# Patient Record
Sex: Male | Born: 1960
Health system: Southern US, Community
[De-identification: ages and names within clinical notes are randomized; demographics above are authoritative.]

## PROBLEM LIST (undated history)

## (undated) DIAGNOSIS — Z8619 Personal history of other infectious and parasitic diseases: Secondary | ICD-10-CM

## (undated) DIAGNOSIS — R7401 Elevation of levels of liver transaminase levels: Secondary | ICD-10-CM

## (undated) DIAGNOSIS — M26609 Unspecified temporomandibular joint disorder, unspecified side: Secondary | ICD-10-CM

## (undated) DIAGNOSIS — T7840XA Allergy, unspecified, initial encounter: Secondary | ICD-10-CM

## (undated) DIAGNOSIS — N419 Inflammatory disease of prostate, unspecified: Secondary | ICD-10-CM

## (undated) DIAGNOSIS — H40009 Preglaucoma, unspecified, unspecified eye: Secondary | ICD-10-CM

## (undated) DIAGNOSIS — H409 Unspecified glaucoma: Secondary | ICD-10-CM

## (undated) DIAGNOSIS — F411 Generalized anxiety disorder: Secondary | ICD-10-CM

## (undated) DIAGNOSIS — N189 Chronic kidney disease, unspecified: Secondary | ICD-10-CM

## (undated) DIAGNOSIS — J189 Pneumonia, unspecified organism: Secondary | ICD-10-CM

## (undated) DIAGNOSIS — R740 Nonspecific elevation of levels of transaminase and lactic acid dehydrogenase [LDH]: Secondary | ICD-10-CM

## (undated) DIAGNOSIS — E785 Hyperlipidemia, unspecified: Secondary | ICD-10-CM

## (undated) DIAGNOSIS — K6289 Other specified diseases of anus and rectum: Secondary | ICD-10-CM

## (undated) DIAGNOSIS — R7402 Elevation of levels of lactic acid dehydrogenase (LDH): Secondary | ICD-10-CM

## (undated) DIAGNOSIS — M1990 Unspecified osteoarthritis, unspecified site: Secondary | ICD-10-CM

## (undated) DIAGNOSIS — H6090 Unspecified otitis externa, unspecified ear: Secondary | ICD-10-CM

## (undated) HISTORY — DX: Unspecified temporomandibular joint disorder, unspecified side: M26.609

## (undated) HISTORY — PX: OTHER SURGICAL HISTORY: SHX169

## (undated) HISTORY — DX: Unspecified glaucoma: H40.9

## (undated) HISTORY — DX: Inflammatory disease of prostate, unspecified: N41.9

## (undated) HISTORY — DX: Unspecified osteoarthritis, unspecified site: M19.90

## (undated) HISTORY — DX: Nonspecific elevation of levels of transaminase and lactic acid dehydrogenase (ldh): R74.0

## (undated) HISTORY — DX: Allergy, unspecified, initial encounter: T78.40XA

## (undated) HISTORY — DX: Elevation of levels of liver transaminase levels: R74.01

## (undated) HISTORY — PX: POLYPECTOMY: SHX149

## (undated) HISTORY — DX: Preglaucoma, unspecified, unspecified eye: H40.009

## (undated) HISTORY — DX: Generalized anxiety disorder: F41.1

## (undated) HISTORY — DX: Pneumonia, unspecified organism: J18.9

## (undated) HISTORY — DX: Elevation of levels of lactic acid dehydrogenase (LDH): R74.02

## (undated) HISTORY — DX: Chronic kidney disease, unspecified: N18.9

## (undated) HISTORY — PX: COLONOSCOPY: SHX174

## (undated) HISTORY — DX: Hyperlipidemia, unspecified: E78.5

## (undated) HISTORY — DX: Unspecified otitis externa, unspecified ear: H60.90

## (undated) HISTORY — PX: VASECTOMY: SHX75

## (undated) HISTORY — DX: Personal history of other infectious and parasitic diseases: Z86.19

## (undated) HISTORY — PX: TONSILLECTOMY: SHX5217

## (undated) HISTORY — DX: Other specified diseases of anus and rectum: K62.89

## (undated) HISTORY — PX: APPENDECTOMY: SHX54

---

## 1998-05-21 ENCOUNTER — Emergency Department (HOSPITAL_COMMUNITY): Admission: EM | Admit: 1998-05-21 | Discharge: 1998-05-21 | Payer: Self-pay | Admitting: Emergency Medicine

## 2001-03-11 ENCOUNTER — Encounter: Payer: Self-pay | Admitting: Urology

## 2001-03-11 ENCOUNTER — Ambulatory Visit (HOSPITAL_BASED_OUTPATIENT_CLINIC_OR_DEPARTMENT_OTHER): Admission: RE | Admit: 2001-03-11 | Discharge: 2001-03-11 | Payer: Self-pay | Admitting: Urology

## 2001-03-14 ENCOUNTER — Emergency Department (HOSPITAL_COMMUNITY): Admission: EM | Admit: 2001-03-14 | Discharge: 2001-03-15 | Payer: Self-pay | Admitting: Emergency Medicine

## 2002-06-30 ENCOUNTER — Encounter: Admission: RE | Admit: 2002-06-30 | Discharge: 2002-06-30 | Payer: Self-pay | Admitting: Urology

## 2002-06-30 ENCOUNTER — Encounter: Payer: Self-pay | Admitting: Urology

## 2005-02-13 ENCOUNTER — Ambulatory Visit: Payer: Self-pay | Admitting: Internal Medicine

## 2005-03-14 ENCOUNTER — Ambulatory Visit: Payer: Self-pay | Admitting: Internal Medicine

## 2006-03-26 ENCOUNTER — Ambulatory Visit: Payer: Self-pay | Admitting: Internal Medicine

## 2006-04-02 ENCOUNTER — Ambulatory Visit: Payer: Self-pay | Admitting: Internal Medicine

## 2006-04-03 ENCOUNTER — Ambulatory Visit: Payer: Self-pay | Admitting: Internal Medicine

## 2007-02-11 ENCOUNTER — Encounter: Payer: Self-pay | Admitting: Internal Medicine

## 2007-02-18 ENCOUNTER — Encounter: Payer: Self-pay | Admitting: Internal Medicine

## 2007-05-14 ENCOUNTER — Telehealth: Payer: Self-pay | Admitting: Internal Medicine

## 2007-05-14 ENCOUNTER — Ambulatory Visit: Payer: Self-pay | Admitting: Internal Medicine

## 2007-05-16 LAB — CONVERTED CEMR LAB
Bilirubin Urine: NEGATIVE
Hemoglobin, Urine: NEGATIVE
Ketones, ur: NEGATIVE mg/dL
Leukocytes, UA: NEGATIVE
Nitrite: NEGATIVE
Specific Gravity, Urine: >=1.03 (ref 1.000–1.03)
Total Protein, Urine: NEGATIVE mg/dL
Urine Glucose: NEGATIVE mg/dL
Urobilinogen, UA: 0.2 (ref 0.0–1.0)
pH: 6 (ref 5.0–8.0)

## 2007-07-17 ENCOUNTER — Ambulatory Visit: Payer: Self-pay | Admitting: Internal Medicine

## 2007-07-17 LAB — CONVERTED CEMR LAB
ALT: 29 units/L (ref 0–53)
AST: 24 units/L (ref 0–37)
Albumin: 4 g/dL (ref 3.5–5.2)
Alkaline Phosphatase: 48 units/L (ref 39–117)
BUN: 17 mg/dL (ref 6–23)
Basophils Absolute: 0 10*3/uL (ref 0.0–0.1)
Basophils Relative: 0.3 % (ref 0.0–1.0)
Bilirubin Urine: NEGATIVE
Bilirubin, Direct: 0.1 mg/dL (ref 0.0–0.3)
CO2: 27 meq/L (ref 19–32)
Calcium: 9.5 mg/dL (ref 8.4–10.5)
Chloride: 104 meq/L (ref 96–112)
Cholesterol: 285 mg/dL (ref 0–200)
Creatinine, Ser: 1 mg/dL (ref 0.4–1.5)
Direct LDL: 200.5 mg/dL
Eosinophils Absolute: 0.1 10*3/uL (ref 0.0–0.6)
Eosinophils Relative: 2.3 % (ref 0.0–5.0)
GFR calc Af Amer: 103 mL/min
GFR calc non Af Amer: 85 mL/min
Glucose, Bld: 122 mg/dL — ABNORMAL HIGH (ref 70–99)
HCT: 46 % (ref 39.0–52.0)
HDL: 41.5 mg/dL (ref >39.0)
Hemoglobin, Urine: NEGATIVE
Hemoglobin: 16.3 g/dL (ref 13.0–17.0)
Ketones, ur: NEGATIVE mg/dL
Leukocytes, UA: NEGATIVE
Lymphocytes Relative: 28.9 % (ref 12.0–46.0)
MCHC: 35.4 g/dL (ref 30.0–36.0)
MCV: 95.6 fL (ref 78.0–100.0)
Monocytes Absolute: 0.6 10*3/uL (ref 0.2–0.7)
Monocytes Relative: 11.9 % — ABNORMAL HIGH (ref 3.0–11.0)
Neutro Abs: 2.9 10*3/uL (ref 1.4–7.7)
Neutrophils Relative %: 56.6 % (ref 43.0–77.0)
Nitrite: NEGATIVE
PSA: 0.83 ng/mL (ref 0.10–4.00)
Platelets: 198 10*3/uL (ref 150–400)
Potassium: 4.5 meq/L (ref 3.5–5.1)
RBC: 4.81 M/uL (ref 4.22–5.81)
RDW: 12.4 % (ref 11.5–14.6)
Sodium: 139 meq/L (ref 135–145)
Specific Gravity, Urine: 1.025 (ref 1.000–1.03)
TSH: 3.31 microintl units/mL (ref 0.35–5.50)
Total Bilirubin: 0.9 mg/dL (ref 0.3–1.2)
Total CHOL/HDL Ratio: 6.9
Total Protein, Urine: NEGATIVE mg/dL
Total Protein: 7 g/dL (ref 6.0–8.3)
Triglycerides: 238 mg/dL (ref 0–149)
Urine Glucose: NEGATIVE mg/dL
Urobilinogen, UA: 0.2 (ref 0.0–1.0)
VLDL: 48 mg/dL — ABNORMAL HIGH (ref 0–40)
WBC: 5 10*3/uL (ref 4.5–10.5)
pH: 6 (ref 5.0–8.0)

## 2007-07-22 ENCOUNTER — Encounter: Payer: Self-pay | Admitting: Internal Medicine

## 2007-07-22 DIAGNOSIS — J189 Pneumonia, unspecified organism: Secondary | ICD-10-CM | POA: Insufficient documentation

## 2007-07-22 DIAGNOSIS — E1169 Type 2 diabetes mellitus with other specified complication: Secondary | ICD-10-CM | POA: Insufficient documentation

## 2007-07-22 DIAGNOSIS — M26609 Unspecified temporomandibular joint disorder, unspecified side: Secondary | ICD-10-CM | POA: Insufficient documentation

## 2007-07-22 DIAGNOSIS — Z9189 Other specified personal risk factors, not elsewhere classified: Secondary | ICD-10-CM | POA: Insufficient documentation

## 2007-07-22 DIAGNOSIS — Z8709 Personal history of other diseases of the respiratory system: Secondary | ICD-10-CM | POA: Insufficient documentation

## 2007-07-22 DIAGNOSIS — H60399 Other infective otitis externa, unspecified ear: Secondary | ICD-10-CM | POA: Insufficient documentation

## 2007-07-22 DIAGNOSIS — E785 Hyperlipidemia, unspecified: Secondary | ICD-10-CM

## 2007-07-23 ENCOUNTER — Ambulatory Visit: Payer: Self-pay | Admitting: Internal Medicine

## 2007-08-13 ENCOUNTER — Telehealth: Payer: Self-pay | Admitting: Internal Medicine

## 2007-08-13 ENCOUNTER — Ambulatory Visit: Payer: Self-pay | Admitting: Internal Medicine

## 2007-08-13 DIAGNOSIS — R7401 Elevation of levels of liver transaminase levels: Secondary | ICD-10-CM | POA: Insufficient documentation

## 2007-08-13 DIAGNOSIS — R740 Nonspecific elevation of levels of transaminase and lactic acid dehydrogenase [LDH]: Secondary | ICD-10-CM

## 2007-08-13 LAB — CONVERTED CEMR LAB
Cholesterol: 171 mg/dL (ref 0–200)
Direct LDL: 97.1 mg/dL
HDL: 48.2 mg/dL (ref >39.0)
Hgb A1c MFr Bld: 6 % (ref 4.6–6.0)
Total CHOL/HDL Ratio: 3.5
Triglycerides: 209 mg/dL (ref 0–149)
VLDL: 42 mg/dL — ABNORMAL HIGH (ref 0–40)

## 2007-08-22 ENCOUNTER — Encounter: Payer: Self-pay | Admitting: Internal Medicine

## 2007-09-23 ENCOUNTER — Ambulatory Visit: Payer: Self-pay | Admitting: Internal Medicine

## 2008-03-04 ENCOUNTER — Telehealth: Payer: Self-pay | Admitting: Internal Medicine

## 2008-05-11 ENCOUNTER — Emergency Department (HOSPITAL_COMMUNITY): Admission: EM | Admit: 2008-05-11 | Discharge: 2008-05-11 | Payer: Self-pay | Admitting: Family Medicine

## 2009-01-31 DIAGNOSIS — K6289 Other specified diseases of anus and rectum: Secondary | ICD-10-CM

## 2009-01-31 HISTORY — DX: Other specified diseases of anus and rectum: K62.89

## 2009-02-02 ENCOUNTER — Telehealth: Payer: Self-pay | Admitting: Internal Medicine

## 2009-02-05 ENCOUNTER — Telehealth: Payer: Self-pay | Admitting: Internal Medicine

## 2009-02-05 ENCOUNTER — Ambulatory Visit: Payer: Self-pay | Admitting: Internal Medicine

## 2009-02-08 ENCOUNTER — Telehealth (INDEPENDENT_AMBULATORY_CARE_PROVIDER_SITE_OTHER): Payer: Self-pay | Admitting: *Deleted

## 2009-02-08 ENCOUNTER — Ambulatory Visit: Payer: Self-pay | Admitting: Internal Medicine

## 2009-02-08 LAB — CONVERTED CEMR LAB
ALT: 30 units/L (ref 0–53)
AST: 31 units/L (ref 0–37)
Albumin: 4.3 g/dL (ref 3.5–5.2)
Alkaline Phosphatase: 61 units/L (ref 39–117)
BUN: 18 mg/dL (ref 6–23)
Basophils Absolute: 0.1 10*3/uL (ref 0.0–0.1)
Basophils Relative: 1 % (ref 0.0–3.0)
Bilirubin Urine: NEGATIVE
Bilirubin, Direct: 0.2 mg/dL (ref 0.0–0.3)
CO2: 27 meq/L (ref 19–32)
Calcium: 9.2 mg/dL (ref 8.4–10.5)
Chloride: 105 meq/L (ref 96–112)
Cholesterol: 206 mg/dL — ABNORMAL HIGH (ref 0–200)
Creatinine, Ser: 1.1 mg/dL (ref 0.4–1.5)
Direct LDL: 104 mg/dL
Eosinophils Absolute: 0.1 10*3/uL (ref 0.0–0.7)
Eosinophils Relative: 1.5 % (ref 0.0–5.0)
GFR calc non Af Amer: 75.71 mL/min (ref >60)
Glucose, Bld: 102 mg/dL — ABNORMAL HIGH (ref 70–99)
HCT: 44.8 % (ref 39.0–52.0)
HDL: 42.8 mg/dL (ref >39.00)
Hemoglobin, Urine: NEGATIVE
Hemoglobin: 15.7 g/dL (ref 13.0–17.0)
Ketones, ur: NEGATIVE mg/dL
Leukocytes, UA: NEGATIVE
Lymphocytes Relative: 24.9 % (ref 12.0–46.0)
Lymphs Abs: 1.5 10*3/uL (ref 0.7–4.0)
MCHC: 35 g/dL (ref 30.0–36.0)
MCV: 95.7 fL (ref 78.0–100.0)
Monocytes Absolute: 0.6 10*3/uL (ref 0.1–1.0)
Monocytes Relative: 10.5 % (ref 3.0–12.0)
Neutro Abs: 3.7 10*3/uL (ref 1.4–7.7)
Neutrophils Relative %: 62.1 % (ref 43.0–77.0)
Nitrite: NEGATIVE
PSA: 0.59 ng/mL (ref 0.10–4.00)
Platelets: 172 10*3/uL (ref 150.0–400.0)
Potassium: 4.1 meq/L (ref 3.5–5.1)
RBC: 4.68 M/uL (ref 4.22–5.81)
RDW: 12.5 % (ref 11.5–14.6)
Sodium: 141 meq/L (ref 135–145)
Specific Gravity, Urine: >=1.03 (ref 1.000–1.030)
TSH: 3.03 microintl units/mL (ref 0.35–5.50)
Total Bilirubin: 1 mg/dL (ref 0.3–1.2)
Total CHOL/HDL Ratio: 5
Total Protein, Urine: NEGATIVE mg/dL
Total Protein: 7.1 g/dL (ref 6.0–8.3)
Triglycerides: 359 mg/dL — ABNORMAL HIGH (ref 0.0–149.0)
Urine Glucose: NEGATIVE mg/dL
Urobilinogen, UA: 0.2 (ref 0.0–1.0)
VLDL: 71.8 mg/dL — ABNORMAL HIGH (ref 0.0–40.0)
WBC: 6 10*3/uL (ref 4.5–10.5)
pH: 5.5 (ref 5.0–8.0)

## 2009-02-09 ENCOUNTER — Ambulatory Visit: Payer: Self-pay | Admitting: Internal Medicine

## 2009-02-09 ENCOUNTER — Ambulatory Visit: Payer: Self-pay | Admitting: Gastroenterology

## 2009-02-09 DIAGNOSIS — F411 Generalized anxiety disorder: Secondary | ICD-10-CM | POA: Insufficient documentation

## 2009-02-10 DIAGNOSIS — H40009 Preglaucoma, unspecified, unspecified eye: Secondary | ICD-10-CM | POA: Insufficient documentation

## 2009-02-15 ENCOUNTER — Ambulatory Visit: Payer: Self-pay | Admitting: Gastroenterology

## 2009-02-15 ENCOUNTER — Encounter: Payer: Self-pay | Admitting: Gastroenterology

## 2009-02-18 ENCOUNTER — Encounter: Payer: Self-pay | Admitting: Gastroenterology

## 2009-10-11 ENCOUNTER — Telehealth: Payer: Self-pay | Admitting: Internal Medicine

## 2010-03-24 ENCOUNTER — Telehealth: Payer: Self-pay | Admitting: Internal Medicine

## 2010-04-22 ENCOUNTER — Telehealth: Payer: Self-pay | Admitting: Internal Medicine

## 2010-05-02 ENCOUNTER — Telehealth (INDEPENDENT_AMBULATORY_CARE_PROVIDER_SITE_OTHER): Payer: Self-pay | Admitting: *Deleted

## 2010-05-03 ENCOUNTER — Ambulatory Visit: Payer: Self-pay | Admitting: Internal Medicine

## 2010-05-03 DIAGNOSIS — M79609 Pain in unspecified limb: Secondary | ICD-10-CM | POA: Insufficient documentation

## 2010-05-05 LAB — CONVERTED CEMR LAB
ALT: 29 units/L (ref 0–53)
AST: 28 units/L (ref 0–37)
Albumin: 3.9 g/dL (ref 3.5–5.2)
Alkaline Phosphatase: 55 units/L (ref 39–117)
Bilirubin, Direct: 0.1 mg/dL (ref 0.0–0.3)
Cholesterol: 224 mg/dL — ABNORMAL HIGH (ref 0–200)
Direct LDL: 136.6 mg/dL
HDL: 42.7 mg/dL (ref >39.00)
Total Bilirubin: 0.7 mg/dL (ref 0.3–1.2)
Total CHOL/HDL Ratio: 5
Total Protein: 6.6 g/dL (ref 6.0–8.3)
Triglycerides: 339 mg/dL — ABNORMAL HIGH (ref 0.0–149.0)
Uric Acid, Serum: 8.7 mg/dL — ABNORMAL HIGH (ref 4.0–7.8)
VLDL: 67.8 mg/dL — ABNORMAL HIGH (ref 0.0–40.0)

## 2010-05-09 ENCOUNTER — Telehealth: Payer: Self-pay | Admitting: Internal Medicine

## 2010-05-30 ENCOUNTER — Ambulatory Visit: Payer: Self-pay | Admitting: Internal Medicine

## 2010-06-10 ENCOUNTER — Ambulatory Visit: Payer: Self-pay | Admitting: Internal Medicine

## 2010-06-20 ENCOUNTER — Ambulatory Visit: Payer: Self-pay | Admitting: Internal Medicine

## 2010-07-25 ENCOUNTER — Ambulatory Visit
Admission: RE | Admit: 2010-07-25 | Discharge: 2010-07-25 | Payer: Self-pay | Source: Home / Self Care | Attending: Internal Medicine | Admitting: Internal Medicine

## 2010-07-25 ENCOUNTER — Encounter: Payer: Self-pay | Admitting: Internal Medicine

## 2010-07-25 DIAGNOSIS — E669 Obesity, unspecified: Secondary | ICD-10-CM | POA: Insufficient documentation

## 2010-07-25 DIAGNOSIS — M109 Gout, unspecified: Secondary | ICD-10-CM | POA: Insufficient documentation

## 2010-08-02 NOTE — Progress Notes (Signed)
Summary: REFILL - Alprazolam  Phone Note Refill Request   Refills Requested: Medication #1:  ALPRAZOLAM 0.5 MG  TABS 1 q 6 hrs as needed [facility]  Initial call taken by: Lamar Sprinkles, CMA,  April 22, 2010 6:08 PM  Follow-up for Phone Call        OK to refill z 5  Follow-up by: Jacques Navy MD,  April 25, 2010 9:00 AM    Prescriptions: ALPRAZOLAM 0.5 MG  TABS (ALPRAZOLAM) 1 q 6 hrs as needed, take 30 min prior to flying  #30 x 5   Entered by:   Lamar Sprinkles, CMA   Authorized by:   Jacques Navy MD   Signed by:   Lamar Sprinkles, CMA on 04/25/2010   Method used:   Telephoned to ...       [facility] [address] (retail)       [address]       [place], [place]  [address]       Ph: [phone] or [phone]       Fax: [phone]   RxID:   [id number]

## 2010-08-02 NOTE — Assessment & Plan Note (Signed)
Summary: per phone note/gout/cd   Vital Signs:  Patient profile:   50 year old male Height:      66 inches Weight:      222 pounds BMI:     35.96 O2 Sat:      97 % on Room air Temp:     98.3 degrees F oral Pulse rate:   74 / minute BP sitting:   126 / 82  (left arm) Cuff size:   large  Vitals Entered By: Ami Bullins CMA (May 03, 2010 9:43 AM)  O2 Flow:  Room air  Primary Care Provider:  Micheal Norins,MD   History of Present Illness: [name] presents today with a five day history of right foot pain.  He noticed the pain friday morning when it awoke him from sleep at 1am. He states that he had been on his feet a lot over the week at work and usually had a general ache at the MTP joint in his big toe secondary to an old injury.  However, he notes that this pain was different than he had ever felt before.  It was throbbing in nature and was the most severe along the bottom of his big toe.  He states that the pain was very severe and he could not put hardly any weight on it at all that night.  HE did noticed that it looked slightly swollen and red at the time fo the pain.  In the morning he took Advil (about 3-4 pills with each meal) and the pain went away and has not returned.  Currently he just reports some soreness but no real pain in his toes.  Current Medications (verified): 1)  Alprazolam 0.5 Mg  Tabs (Alprazolam) .Marland Kitchen.. 1 Every 6 Hours As Needed, Take 30 Minutes Prior To Flying 2)  Crestor 5 Mg  Tabs (Rosuvastatin Calcium) .Marland Kitchen.. 1 By Mouth Daily 3)  Anamantle Hc 3-0.5 % Crea (Lidocaine-Hydrocortisone Ace) .... Rectal Applicator Every 4 Hrs As Needed  Allergies (verified): No Known Drug Allergies  Past History:  Past Medical History: Last updated: 02/09/2009 ANXIETY (ICD-300.00) ANAL OR RECTAL PAIN (ZOX-096.04) Aug '10 ALANINE AMINOTRANSFERASE, SERUM, ELEVATED (ICD-790.4) * TRACHEOTOMY Hx of UNSPECIFIED PREGLAUCOMA (ICD-365.00) Hx of TMJ SYNDROME  (ICD-524.60) EXTERNAL OTITIS (ICD-380.10) PROSTATITIS, HX OF 1999 (ICD-V13.09) Hx of PNEUMONIA, BILATERAL (ICD-486) CHICKENPOX, HX OF (ICD-V15.9) PNEUMONIA, HX OF (ICD-V12.60) HYPERLIPIDEMIA (ICD-272.4)  Past Surgical History: Last updated: 02/09/2009 Vasectomy * TRACHEOTOMY Appendectomy  Family History: Last updated: 02/09/2009 father -[birth year]19, RA, Prostate cancer mother - deceased @ 32; broke her hip, EtOH,  Neg- CAD/MI, colon cancer Pos_ lipids Family History of Colon Polyps: father Family History of Breast Cancer: mother  Social History: Last updated: 02/09/2009 HSG married - '87 1 daughter - '87, '90, '97 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 Alcohol Use - yes 3 beers a day Daily Caffeine Use 3 coffee  Risk Factors: Alcohol Use: 3 (07/23/2007) Caffeine Use: 5+ (07/23/2007) Exercise: no (07/23/2007)  Risk Factors: Smoking Status: quit (09/23/2007) Packs/Day: 1/2 ppd (07/22/2007)  Physical Exam  General:  alert, well-developed, and well-nourished.   Head:  normocephalic and atraumatic.   Eyes:  pupils equal, pupils round, and pupils reactive to light.   Nose:  no external deformity and no nasal discharge.   Mouth:  pharynx pink and moist, no erythema, and no exudates.   Neck:  supple, full ROM, and no masses.   Lungs:  normal respiratory effort, normal breath sounds, no  crackles, and no wheezes.   Heart:  normal rate, regular rhythm, no murmur, no gallop, and no rub.   Msk:  normal ROM,  joint warmth, and no crepitation.  Slight tenderness to palpation and erythema over the 1st right MTP joint.  Some limited ROM at the same MTP joint. Pulses:  R radial normal and L radial normal.   Neurologic:  alert & oriented X3, cranial nerves II-XII intact, strength normal in all extremities, sensation intact to light touch, and sensation intact to pinprick.   Skin:  turgor normal, color normal, and no rashes.   Psych:   Oriented X3, memory intact for recent and remote, and good eye contact.     Impression & Recommendations:  Problem # 1:  FOOT PAIN, RIGHT (ICD-729.5) This appears to be an actue attack of gouty arthritis due to the location at the MTP joint, the severity of the pain, and the relief of the pain with NSAID use.  Plan:  Test blood urinc acid level           X-ray series of the foot           Diet changes - patient given information about low protein diet.   Orders: T-Foot Right (73630TC) TLB-Uric Acid, Blood (84550-URIC)  DG FOOT COMPLETE*R* - [id number]   Clinical Data: Right first MTP joint pain   RIGHT FOOT COMPLETE - 3+ VIEW   Comparison: None.   Findings: Degenerative osteoarthritic changes of the right first MTP joint with sclerosis, joint space narrowing and osteophyte formation.  Normal alignment.  Negative for fracture.   IMPRESSION: Right first MTP joint osteoarthritis.   Read By:  Sigurd Sos.,  M.D.  addendum- uric acid 8.7  Problem # 2:  HYPERLIPIDEMIA (ICD-272.4) Due to the patients history of hyperlipidemnia and since he is due for a physical soon LFTs and lipid panel will be ordered as well.  Plan:  Lipid pannel to monitor lipid levels.             Liver function test since he is on Crestor  His updated medication list for this problem includes:    Crestor 5 Mg Tabs (Rosuvastatin calcium) .Marland Kitchen... 1 by mouth daily  Orders: TLB-Lipid Panel (80061-LIPID) TLB-Hepatic/Liver Function Pnl (80076-HEPATIC)  addendum LDL 136.6 Continue present medications.   Complete Medication List: 1)  Alprazolam 0.5 Mg Tabs (Alprazolam) .Marland Kitchen.. 1 every 6 hours as needed, take 30 minutes prior to flying 2)  Crestor 5 Mg Tabs (Rosuvastatin calcium) .Marland Kitchen.. 1 by mouth daily 3)  Anamantle Hc 3-0.5 % Crea (Lidocaine-hydrocortisone ace) .... Rectal applicator every 4 hrs as needed   Orders Added: 1)  T-Foot Right [73630TC] 2)  TLB-Uric Acid, Blood [84550-URIC] 3)  TLB-Lipid  Panel [80061-LIPID] 4)  TLB-Hepatic/Liver Function Pnl [80076-HEPATIC] 5)  Est. Patient Level III [62952]

## 2010-08-02 NOTE — Progress Notes (Signed)
  Phone Note Refill Request Message from:  Fax from Pharmacy on October 11, 2009 4:24 PM  Refills Requested: Medication #1:  ALPRAZOLAM 0.5 MG  TABS 1 q 6 hrs as needed   Last Refilled: 02/05/2009 [facility] [address], please Advise refill.  Initial call taken by: Ami Bullins CMA,  October 11, 2009 4:24 PM  Follow-up for Phone Call        ok for refill x 6 months Follow-up by: Jacques Navy MD,  October 12, 2009 5:43 AM    Prescriptions: ALPRAZOLAM 0.5 MG  TABS (ALPRAZOLAM) 1 q 6 hrs as needed, take 30 min prior to flying  #30 x 5   Entered by:   Ami Bullins CMA   Authorized by:   Jacques Navy MD   Signed by:   Bill Salinas CMA on 10/12/2009   Method used:   Telephoned to ...       [facility] [address] (retail)       [address]       [place], [place]  [address]       Ph: [phone] or [phone]       Fax: [phone]   RxID:   [id number]

## 2010-08-02 NOTE — Progress Notes (Signed)
Summary: L[facility]  Phone Note Refill Request Message from:  Fax from Pharmacy on March 24, 2010 2:59 PM  Refills Requested: Medication #1:  ANAMANTLE HC 3-0.5 % CREA rectal applicator every 4 hrs as needed. Please Advise refills  Initial call taken by: Ami Bullins CMA,  March 24, 2010 3:00 PM  Follow-up for Phone Call        ok for refill as needed  Follow-up by: Jacques Navy MD,  March 25, 2010 6:23 PM    Prescriptions: ANAMANTLE HC 3-0.5 % CREA (LIDOCAINE-HYDROCORTISONE ACE) rectal applicator every 4 hrs as needed  #7g x 12   Entered by:   Lamar Sprinkles, CMA   Authorized by:   Jacques Navy MD   Signed by:   Lamar Sprinkles, CMA on 03/25/2010   Method used:   Electronically to        [facility] [phone][facility] (retail)       [address]       [place], [place]  [address]       Ph: [phone] or [phone]       Fax: [phone]   RxID:   [id number]

## 2010-08-02 NOTE — Progress Notes (Signed)
Summary: RESULTS  Phone Note Call from Patient   Summary of Call: Patient is requesting results of xray and labs. I see you left vm but he did not get message, what do I tell pt?  (left vm for pt to check his messages for Vm from MD) Initial call taken by: Lamar Sprinkles, CMA,  May 09, 2010 3:20 PM  Follow-up for Phone Call        uric acid was 8.7 elevated - would usually not start prophylaxis unless more than 2 events in 12 months , but I am negotiable. X-ray reveals OA at the great toe joint. No gout related changes - tophi Follow-up by: Jacques Navy MD,  May 10, 2010 1:13 PM  Additional Follow-up for Phone Call Additional follow up Details #1::        Pt informed, he would like to start preventative treatment for gout. Pt states he travels for work often and has trouble getting seen by an MD for gout flares.   Also would like to know results of lipids but is ok to wait to discuss at cpx apt in December.  Additional Follow-up by: Lamar Sprinkles, CMA,  May 10, 2010 5:38 PM    Additional Follow-up for Phone Call Additional follow up Details #2::    allopurinol 100mg  once daily x 10 days, then advance to 200mg   a day x 10 days, then 300mg  x 10 days and after day 10 come for lab - Uric acid 274.9. Continue 300mg  daily but will increase if uric acid not controlled. # 100. If 300mg  is maintenance dose will change to a 300mg  tablet.  LDL 136.+ - wioth a goal of 130 or less.   Follow-up by: Jacques Navy MD,  May 10, 2010 5:45 PM  Additional Follow-up for Phone Call Additional follow up Details #3:: Details for Additional Follow-up Action Taken: Pt informed  Additional Follow-up by: Lamar Sprinkles, CMA,  May 11, 2010 10:14 AM  New/Updated Medications: ALLOPURINOL 100 MG TABS (ALLOPURINOL) 1 once daily x 10 days, then 2 once daily x 10 days then 3 once daily x 10 days Prescriptions: ALLOPURINOL 100 MG TABS (ALLOPURINOL) 1 once daily x 10 days, then 2 once  daily x 10 days then 3 once daily x 10 days  #100 x 0   Entered by:   Lamar Sprinkles, CMA   Authorized by:   Jacques Navy MD   Signed by:   Lamar Sprinkles, CMA on 05/11/2010   Method used:   Electronically to        [facility] [phone][facility] (retail)       [address]       [place], [place]  [address]       Ph: [phone] or [phone]       Fax: [phone]   RxID:   [id number]

## 2010-08-02 NOTE — Progress Notes (Signed)
Summary: NEEDS OV   Phone Note Call from Patient   Summary of Call: Pt left vm, he thinks he may have gout problem w/his foot. Please call pt, he needs office visit w/Norins for eval. THANKS Initial call taken by: Lamar Sprinkles, CMA,  May 02, 2010 11:45 AM  Follow-up for Phone Call        Sched for appt 11/1 at 9:45 am for acute visit to check foot. Pt aware this is to eval foot. Follow-up by: Verdell Face,  May 02, 2010 11:54 AM

## 2010-08-04 NOTE — Assessment & Plan Note (Signed)
Summary: CPX/ [facility]WS  //cd   Vital Signs:  Patient profile:   50 year old male Height:      66 inches Weight:      213 pounds BMI:     34.50 O2 Sat:      96 % on Room air Temp:     98.4 degrees F oral Pulse rate:   64 / minute BP sitting:   106 / 68  (left arm) Cuff size:   large  Vitals Entered By: Ami Bullins CMA (July 25, 2010 9:59 AM)  O2 Flow:  Room air CC: pt here for cpx/ ab   Primary Care Provider:  Micheal Izaac Reisig,MD  CC:  pt here for cpx/ ab.  History of Present Illness: Patient presents for annual wellness exam. He has had no major illness, injury or surgery in the interval since his last visit. He has had a gain of 10 to 12 lbs. He is working out of town a lot and has had more barriers to proper diet and to regular exercise. He also has not been able to make his usual hunting trips. Otherwise he is doing well. He does admit to a high stress, 15hr/day job. With all this he feels his home life is better than ever.   Preventive Screening-Counseling & Management  Alcohol-Tobacco     Alcohol drinks/day: 4-6     Alcohol type: beer     >5/day in last 3 mos: yes     Alcohol Counseling: to decrease amount and/or frequency of alcohol intake     Feels need to cut down: yes     Feels annoyed by complaints: no     Feels guilty re: drinking: no     Needs 'eye opener' in am: no     Smoking Status: never  Caffeine-Diet-Exercise     Caffeine use/day: 1-2 caffienated beverages /er dap     Diet Comments: needs improvement     Diet Counseling: to improve diet; diet is suboptimal     Does Patient Exercise: no  Hep-HIV-STD-Contraception     Hepatitis Risk: no risk noted     HIV Risk: no risk noted     STD Risk: no risk noted     Sun Exposure-Excessive: no  Safety-Violence-Falls     Seat Belt Use: yes     Helmet Use: n/a     Firearms in the Home: firearms in the home     Smoke Detectors: yes      Sexual History:  currently monogamous.        Drug Use:  never.     Blood Transfusions:  no.    Current Medications (verified): 1)  Alprazolam 0.5 Mg  Tabs (Alprazolam) .Marland Kitchen.. 1 Every 6 Hours As Needed, Take 30 Minutes Prior To Flying 2)  Crestor 5 Mg  Tabs (Rosuvastatin Calcium) .Marland Kitchen.. 1 By Mouth Daily 3)  Anamantle Hc 3-0.5 % Crea (Lidocaine-Hydrocortisone Ace) .... Rectal Applicator Every 4 Hrs As Needed 4)  Allopurinol 100 Mg Tabs (Allopurinol) .Marland Kitchen.. 1 Once Daily X 10 Days, Then 2 Once Daily X 10 Days Then 3 Once Daily X 10 Days  Allergies (verified): 1)  ! Cipro  Past History:  Past Medical History: Last updated: 02/09/2009 ANXIETY (ICD-300.00) ANAL OR RECTAL PAIN (ZOX-096.04) Aug '10 ALANINE AMINOTRANSFERASE, SERUM, ELEVATED (ICD-790.4) * TRACHEOTOMY Hx of UNSPECIFIED PREGLAUCOMA (ICD-365.00) Hx of TMJ SYNDROME (ICD-524.60) EXTERNAL OTITIS (ICD-380.10) PROSTATITIS, HX OF 1999 (ICD-V13.09) Hx of PNEUMONIA, BILATERAL (ICD-486) CHICKENPOX, HX OF (ICD-V15.9) PNEUMONIA, HX OF (  ICD-V12.60) HYPERLIPIDEMIA (ICD-272.4)  Past Surgical History: Last updated: 02/09/2009 Vasectomy * TRACHEOTOMY Appendectomy  Family History: Last updated: 02/09/2009 father -[birth year]19, RA, Prostate cancer mother - deceased @ 41; broke her hip, EtOH,  Neg- CAD/MI, colon cancer Pos_ lipids Family History of Colon Polyps: father Family History of Breast Cancer: mother  Social History: HSG married - '87 3 daughters - '87, '90, '97 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 Alcohol Use - yes 3+ beers a day Daily Caffeine Use 3 coffee Smoking Status:  never Caffeine use/day:  1-2 caffienated beverages /er dap Hepatitis Risk:  no risk noted HIV Risk:  no risk noted STD Risk:  no risk noted Sun Exposure-Excessive:  no Seat Belt Use:  yes Sexual History:  currently monogamous Drug Use:  never Blood Transfusions:  no  Review of Systems       The patient complains of weight gain and suspicious skin lesions.  The patient  denies anorexia, fever, weight loss, vision loss, decreased hearing, hoarseness, chest pain, dyspnea on exertion, peripheral edema, prolonged cough, headaches, abdominal pain, severe indigestion/heartburn, incontinence, muscle weakness, difficulty walking, depression, abnormal bleeding, and enlarged lymph nodes.    Physical Exam  General:  alert, well-developed, and well-hydrated, overweight white male in no distress.   Head:  normocephalic, atraumatic, and no abnormalities observed.   Eyes:  vision grossly intact, pupils equal, pupils round, and corneas and lenses clear.   Ears:  R ear normal and L ear normal.   Nose:  no external deformity, no external erythema, and no nasal discharge.   Mouth:  Oral mucosa and oropharynx without lesions or exudates.  Teeth in good repair. Neck:  supple, full ROM, no thyromegaly, and no carotid bruits.   Chest Wall:  No deformities, masses, tenderness or gynecomastia noted. Lungs:  Normal respiratory effort, chest expands symmetrically. Lungs are clear to auscultation, no crackles or wheezes. Heart:  Normal rate and regular rhythm. S1 and S2 normal without gallop, murmur, click, rub or other extra sounds. Abdomen:  overweight, soft, non-tender, normal bowel sounds, no guarding, no rigidity, no abdominal hernia, and no hepatomegaly.   Prostate:  deferred to up-coming PSA Msk:  normal ROM, no joint tenderness, no joint swelling, no joint warmth, and no redness over joints.   Pulses:  2+ radial Neurologic:  alert & oriented X3, cranial nerves II-XII intact, strength normal in all extremities, gait normal, and DTRs symmetrical and normal.   Skin:  dark, varigated lesion right temple with an irregular border and raised. Reportedly changing. Cervical Nodes:  no anterior cervical adenopathy and no posterior cervical adenopathy.   Psych:  Oriented X3, memory intact for recent and remote, normally interactive, and good eye contact.     Impression &  Recommendations:  Problem # 1:  Hx of UNSPECIFIED PREGLAUCOMA (ICD-365.00) Patient has just seen his opthamologist and is doing well.   Problem # 2:  HYPERLIPIDEMIA (ICD-272.4) Reviewd lipid panels: in '09 LDL 200; with treatment LDL as low as 104. Last lab in November '11 with HDL 41 LDL 136.6. He admits that he doesn't take Crestor regularly. Framingham data risk calculator with 10 year risk for cardiac event at 5% (low risk) based on last labs and current BP.   Plan - take Crestor daily          f/u lab May '12  His updated medication list for this problem includes:    Crestor 5 Mg Tabs (Rosuvastatin calcium) .Marland Kitchen... 1 by mouth  daily  Problem # 3:  OBESITY, CLASS I (ICD-278.02) Discussed the overall risk to his health posed by being overweight. Discussed weight management: smart food choices; PORTION SIZE  control and regular aerobic exercise - 15-60min daily. Target weight 190lbs; goal - to loose 1 lb per month to target.   Problem # 4:  NEOPLASM, SKIN, UNCERTAIN BEHAVIOR (ICD-238.2) suspicious mole at right temple.  Plan - patient to schedule mole excision in 1 month  Problem # 5:  Preventive Health Care (ICD-V70.0) Interval history without illness but he has had weight gain. He does have a stressfull job and he is on the road a lot. He does c/o nocturia from 1-3 times a night. His exam is benign. Previous labs are OK except for cholesterol levels.  He is current with colorectal cancer screening August '10 due for follow-up in '20. Last PSA August '10 was 0.59 and will have repeat lab May '12. 12 lead EKG without evidence of ischemia or abnormality.  In summary - a very nice man who has a stressful work load and has neglected his health: exercise and diet. He resolves to do better. Will recheck labs in May '12.   Problem # 6:  GOUT, UNSPECIFIED (ICD-274.9) Did review last lab with elevated uric acid level, great than 8. He has had only one episode of gout. He has stopped taking  allopurinol.  Plan - low purine diet           for gout flare ok to use OTC NSAIDS  His updated medication list for this problem includes:    Allopurinol 100 Mg Tabs (Allopurinol) .Marland Kitchen... 1 once daily x 10 days, then 2 once daily x 10 days then 3 once daily x 10 days  Complete Medication List: 1)  Alprazolam 0.5 Mg Tabs (Alprazolam) .Marland Kitchen.. 1 every 6 hours as needed, take 30 minutes prior to flying 2)  Crestor 5 Mg Tabs (Rosuvastatin calcium) .Marland Kitchen.. 1 by mouth daily 3)  Anamantle Hc 3-0.5 % Crea (Lidocaine-hydrocortisone ace) .... Rectal applicator every 4 hrs as needed 4)  Allopurinol 100 Mg Tabs (Allopurinol) .Marland Kitchen.. 1 once daily x 10 days, then 2 once daily x 10 days then 3 once daily x 10 days  Other Orders: EKG w/ Interpretation (93000)  Patient: WARNER LADUCA Note: All result statuses are Final unless otherwise noted.  Tests: (1) Uric Acid (URIC)   Uric Acid            [H]  8.7 mg/dL                   1.6-1.0  Tests: (2) Lipid Panel (LIPID)   Cholesterol          [H]  224 mg/dL                   9-604     ATP III Classification            Desirable:  < 200 mg/dL                    Borderline High:  200 - 239 mg/dL               High:  > = 240 mg/dL   Triglycerides        [H]  339.0 mg/dL                 5.4-098.1     Normal:  <150 mg/dL     Borderline High:  150 - 199 mg/dL   HDL                       13.08 mg/dL                 >65.78   VLDL Cholesterol     [H]  67.8 mg/dL                  4.6-96.2  CHO/HDL Ratio:  CHD Risk                             5                    Men          Women     1/2 Average Risk     3.4          3.3     Average Risk          5.0          4.4     2X Average Risk          9.6          7.1     3X Average Risk          15.0          11.0                           Tests: (3) Hepatic/Liver Function Panel (HEPATIC)   Total Bilirubin           0.7 mg/dL                   9.5-2.8   Direct Bilirubin          0.1 mg/dL                   4.1-3.2    Alkaline Phosphatase      55 U/L                      39-117   AST                       28 U/L                      0-37   ALT                       29 U/L                      0-53   Total Protein             6.6 g/dL                    4.4-0.1   Albumin                   3.9 g/dL                    0.2-7.2  Tests: (4) Cholesterol LDL - Direct (DIRLDL)  Cholesterol LDL - Direct  136.6 mg/dL  Orders Added: 1)  Est. Patient 40-64 years [99396] 2)  Est. Patient Level III [01027] 3)  EKG w/ Interpretation [93000]

## 2010-08-26 ENCOUNTER — Telehealth: Payer: Self-pay | Admitting: Internal Medicine

## 2010-08-26 ENCOUNTER — Other Ambulatory Visit: Payer: 59

## 2010-08-30 NOTE — Progress Notes (Signed)
Summary: LABS FYI  Phone Note From Other Clinic   Summary of Call: Pt came into lab early this am and told them he had cpx scheduled. They drew labs for cpx orders and called to get details of what MD would like. Per last office visit notes - pt is due for labs 11/2010. Advised pt of this and specimen was discarded. He thought b/c he was comming in for mole removal he would do labs early. I advised it would be best to stay w/May date w/fear that insurance may not pay b/c it may be too early.  Initial call taken by: Lamar Sprinkles, CMA,  August 26, 2010 11:23 AM

## 2010-09-02 ENCOUNTER — Other Ambulatory Visit: Payer: Self-pay | Admitting: Internal Medicine

## 2010-09-02 ENCOUNTER — Encounter: Payer: Self-pay | Admitting: Internal Medicine

## 2010-09-02 ENCOUNTER — Ambulatory Visit (INDEPENDENT_AMBULATORY_CARE_PROVIDER_SITE_OTHER): Payer: 59 | Admitting: Internal Medicine

## 2010-09-02 ENCOUNTER — Ambulatory Visit: Payer: Self-pay | Admitting: Internal Medicine

## 2010-09-02 DIAGNOSIS — D485 Neoplasm of uncertain behavior of skin: Secondary | ICD-10-CM

## 2010-09-08 NOTE — Assessment & Plan Note (Signed)
Summary: mole removal   Vital Signs:  Patient profile:   50 year old male Height:      66 inches Weight:      223 pounds BMI:     36.12 O2 Sat:      97 % on Room air Temp:     97.8 degrees F oral Pulse rate:   59 / minute BP sitting:   100 / 68  (left arm) Cuff size:   large  Vitals Entered By: Ami Bullins CMA (September 02, 2010 8:50 AM)  O2 Flow:  Room air CC: pt here for mole removal / ab   Primary Care Provider:  Micheal Akshaya Toepfer,MD  CC:  pt here for mole removal / ab.  History of Present Illness: Jeffrey Dougherty presents for mole removal - change, varigated mole right scalp at the hair line.   Current Medications (verified): 1)  Alprazolam 0.5 Mg  Tabs (Alprazolam) .Marland Kitchen.. 1 Every 6 Hours As Needed, Take 30 Minutes Prior To Flying 2)  Crestor 5 Mg  Tabs (Rosuvastatin Calcium) .Marland Kitchen.. 1 By Mouth Daily 3)  Anamantle Hc 3-0.5 % Crea (Lidocaine-Hydrocortisone Ace) .... Rectal Applicator Every 4 Hrs As Needed 4)  Allopurinol 100 Mg Tabs (Allopurinol) .Marland Kitchen.. 1 Once Daily X 10 Days, Then 2 Once Daily X 10 Days Then 3 Once Daily X 10 Days  Allergies (verified): 1)  ! Cipro   Impression & Recommendations:  Problem # 1:  NEOPLASM, SKIN, UNCERTAIN BEHAVIOR (ICD-238.2)  removed suspicious mole from scalp. Path pending.   Orders: Shave Skin Lesion <0.5cm Scalp/neck/hands/feet/genitalia (82956)  Complete Medication List: 1)  Alprazolam 0.5 Mg Tabs (Alprazolam) .Marland Kitchen.. 1 every 6 hours as needed, take 30 minutes prior to flying 2)  Crestor 5 Mg Tabs (Rosuvastatin calcium) .Marland Kitchen.. 1 by mouth daily 3)  Anamantle Hc 3-0.5 % Crea (Lidocaine-hydrocortisone ace) .... Rectal applicator every 4 hrs as needed 4)  Allopurinol 100 Mg Tabs (Allopurinol) .Marland Kitchen.. 1 once daily x 10 days, then 2 once daily x 10 days then 3 once daily x 10 days   Orders Added: 1)  Shave Skin Lesion <0.5cm Scalp/neck/hands/feet/genitalia [11305]     Procedure Note  Mole Biopsy/Removal: The patient complains of changing  mole. Indication: changing lesion  Procedure # 1: shave biopsy    Size (in cm): .3 x .5    Location: right scalp anterior    Instrument used: dermablade    Anesthesia: 2% xylo w/ epi    Closure: hyfrecator  Cleaned and prepped with: betadine Additional Instructions: routine verbal informed consent. Routine wound precautions. Specimen to lab.

## 2010-09-13 NOTE — Miscellaneous (Signed)
Summary: Consent for Mole Removal / [place]  Consent for Mole Removal / [place]   Imported By: Lennie Odor 09/05/2010 10:40:01  _____________________________________________________________________  External Attachment:    Type:   Image     Comment:   External Document

## 2010-11-18 ENCOUNTER — Other Ambulatory Visit: Payer: 59

## 2010-11-18 ENCOUNTER — Other Ambulatory Visit: Payer: Self-pay | Admitting: Internal Medicine

## 2010-11-18 DIAGNOSIS — Z1289 Encounter for screening for malignant neoplasm of other sites: Secondary | ICD-10-CM

## 2010-11-18 DIAGNOSIS — T887XXA Unspecified adverse effect of drug or medicament, initial encounter: Secondary | ICD-10-CM

## 2010-11-18 DIAGNOSIS — E785 Hyperlipidemia, unspecified: Secondary | ICD-10-CM

## 2010-11-18 NOTE — Op Note (Signed)
[facility]  Patient:    Jeffrey Dougherty, Jeffrey Dougherty Visit Number: [id number] MRN: [id number]          Service Type: NES Location: [facility] Attending Physician:  Trisha Mangle Dictated by:   Veverly Fells Vernie Ammons, M.D. Proc. Date: 03/11/01 Admit Date:  03/11/2001                             Operative Report  PREOPERATIVE DIAGNOSIS:  Right ureteral calculus.  POSTOPERATIVE DIAGNOSIS:  Right ureteral calculus.  PROCEDURE:  Cystoscopy, right retrograde pyelogram with interpretation, right ureteroscopic stone extraction and double J stent placement.  SURGEON:  Mark C. Vernie Ammons, M.D.  ANESTHESIA:  General.  DRAINS:  A 4.7 French 26 cm length double J stent with string.  SPECIMEN:  Stone given to patient.  ESTIMATED BLOOD LOSS:  10 cc.  COMPLICATIONS:  None.  INDICATIONS FOR PROCEDURE:  The patient is a 50 year old white male whose had intermittent right flank pain secondary to a distal right ureteral calculus. He has been followed for some time and has not progressed. He is brought to the OR for ureteroscopic extraction and understands the risks, complications, alternatives, and limitations.  DESCRIPTION OF PROCEDURE:  After informed consent, the patient brought to the major OR, placed on the table and administered general anesthesia and then moved to the dorsal lithotomy position. His genitalia was sterilely prepped and draped and initially a 19 French cystoscopic sheath was introduced in the bladder. The 12 degree lens was inserted and the bladder was fully inspected and noted to be free of any tumor, stones or inflammatory lesions. The right ureteral orifice was identified and cannulated with the 6 French open end ureteral catheter and a retrograde pyelogram was performed in the standard fashion. It revealed filling defects in the distal ureter consistent with those seen on his KUB taken earlier in the morning. I therefore inserted a 0.038 inch floppy tip  guidewire through the cystoscope up the right ureter under direct fluoroscopic control into the renal pelvis after which the ureteral dilating balloon was then passed over the guidewire and the ureteral orifice and distal ureter were dilated. I then removed the dilating balloon and left the guidewire in place. Next to the guidewire, the 6 French rigid ureteroscope was then passed into the distal ureter and the stone was observed and photographed and then extracted using [employer]. I then reinserted the ureteroscope and noted a second smaller stone which was grasped with a three prong grasper. Finally, I inserted the scope a last time, passed it up the ureter and noted no further stones or other abnormalities.  The cystoscope was back loaded over the guidewire and the double J stent passed over the guidewire. The guidewire removed with good curl being noted in the renal pelvis and bladder. The bladder was drained. The string on the distal aspect of the stent affixed to the dorsum of the penis and 2% lidocaine jelly was used to fill the urethra and a B&O suppository was administered per rectum. He received 30 mg of Toradol IV and was awakened and taken to the recovery room in stable satisfactory condition. The patient tolerated the procedure well and there were no intraoperative complications.  I will give him the stone and have him bring it back to the office for analysis. I have written a prescription for Vicodin ES #24.  DISCHARGE INSTRUCTIONS:  Written discharge instructions were given to the patient and he will  return to the office to get his stent removed later this weekend. Dictated by:   Veverly Fells Vernie Ammons, M.D. Attending Physician:  Trisha Mangle DD:  03/11/01 TD:  03/11/01 Job: [id number]

## 2010-11-18 NOTE — Assessment & Plan Note (Signed)
[facility]                             PRIMARY CARE OFFICE NOTE   BERKLEY, CRONKRIGHT                       MRN:          [id number]  DATE:04/03/2006                            DOB:          August 24, 1960    [name] is a 50 year old Caucasian male who presents himself for a  general physical exam on today's visit. Of note, the patient was seen on  April 02, 2006 yesterday, for a external hemorrhoid. This was incised.  Patient reports that overnight and early today, he has had some discomfort.  He has a palpable nodule in the area of the hemorrhoid. He reports he has  had some scant bleeding.   PAST MEDICAL HISTORY:  Well documented in my note of March 14, 2005, is  his family history and social history with no significant changes.   CURRENT MEDICATIONS:  Alprazolam 0.5 mg, 1/2 of one tablet, q. a.m. p.r.n.,  usually for travel anxiety.   REVIEW OF SYSTEMS:  Negative for any constitutional symptoms, __________,  cardiovascular, respiratory, genitourinary or musculoskeletal problems.   PHYSICAL EXAMINATION:  VITAL SIGNS: Temperature 87.6, blood pressure 126/82,  pulse 76, weight 219.  GENERAL: A stocky, athletic appearing gentleman in no acute distress.  HEENT: Normocephalic and atraumatic. External auditory canals and TMs were  unremarkable. Oral pharynx was native dentition in good repaired. No buccal  or palate lesions were noted.  Posterior pharynx was clear.  Conjunctivae  and sclerae was clear. PERRLA.  EOMI.  Funduscopic exam was unremarkable.  NECK: Supple without thyromegaly, nodes or lymphadenopathy was noted in the  cervical or supraclavicular region.  CHEST:  No cva tenderness.  LUNGS:  Clear to auscultation and percussion.  CARDIOVASCULAR: 2+ radial pulse, no jugular vein distention or carotid  bruits. He had a quiet precordium with a regular rate and rhythm without  murmurs, rubs or gallops.  ABDOMEN: Soft, no guarding or  rebound.  No organomegaly or splenomegaly was  noted.  GENITALIA: Normal male phallus.  Bilaterally descending testicles without  masses.  RECTAL: Patient does have some scant blood at the anus. Hemorrhoid tag is  visible. This was palpated carefully, it is firm and somewhat raised but it  was not able to express any additional blood clot. The exam was tender.  PROSTATE: Exam was differed secondary to painful rectal exam.  EXTREMITIES: Without cyanosis, clubbing, edema or deformity.  NEUROLOGIC: Grossly nonfocal.   DATABASE:  Hemoglobin 16 grams, white count 5200 with a normal differential.  Cholesterol was 264, triglycerides 258.  ACL was 38.3, HDL 173.7.  Chemistries were notable for a serum glucose of 116.  Electrolytes, liver  functions and kidney functions were normal. GFR was 77 mL per minute.  TSH  was normal at 1.92.  Urinalysis was negative.   ASSESSMENT AND PLAN:  1. External hemorrhoid, patient is status post incision of thrombosis      hemorrhoid. He has done reasonably well. He still has a scant amount of      bleeding and mild tenderness but he is markedly improved.   PLAN:  1. Patient  to do sitz baths at least daily for the next several days. He      is to ensure that he has an easy strained bowel habit. He is to notify      me if his pain continues or persists or if he has persistent bleeding.  2. Hyperlipidemia. The patient had been on Lipitor in the past with good      results, however he had an elevated creatinine kinase up to greater      than 400 which came down to 184 after cessation of medication. Patient      also had sedimentation rate and rheumatoid factors at that time which      were negative. Patient does have significant hyperlipidemia at this      time. PLAN: Patient was given a sample of Vytorin 10/20 to take for      lipid management. Four weeks is provided. He will return in three weeks      for laboratory. He is to notify me if he has recurrent  myalgias or      arthralgias.  3. Tobacco abuse, I believe the patient at this point is not using      tobacco.  4. Plantar fasciitis, resolved, patient did have a problem and was      referred to Dr. Aldean Baker.  Note in the office chart from October      2006 reveals patient had been treated with aggressive exercise, no      surgical intervention was required.  5. Anxiety, patient does have occasional anxiety with flying in airplanes.      He was given a renewal on Xanax to be used for this purpose.   SUMMARY:  A very pleasant gentleman who does seem medically stable at this  time. He will return for followup lipid study in 3-4 weeks as noted. He will  notify me if he has any intolerance to medications.            ______________________________  Rosalyn Gess Norins, MD      MEN/MedQ  DD:  04/03/2006  DT:  04/05/2006  Job #:  [id number]   cc:   Ralene Bathe

## 2010-11-21 ENCOUNTER — Other Ambulatory Visit (INDEPENDENT_AMBULATORY_CARE_PROVIDER_SITE_OTHER): Payer: 59 | Admitting: Internal Medicine

## 2010-11-21 ENCOUNTER — Other Ambulatory Visit: Payer: Self-pay | Admitting: Internal Medicine

## 2010-11-21 ENCOUNTER — Other Ambulatory Visit (INDEPENDENT_AMBULATORY_CARE_PROVIDER_SITE_OTHER): Payer: 59

## 2010-11-21 DIAGNOSIS — T887XXA Unspecified adverse effect of drug or medicament, initial encounter: Secondary | ICD-10-CM

## 2010-11-21 DIAGNOSIS — E785 Hyperlipidemia, unspecified: Secondary | ICD-10-CM

## 2010-11-21 DIAGNOSIS — Z13220 Encounter for screening for lipoid disorders: Secondary | ICD-10-CM

## 2010-11-21 DIAGNOSIS — Z1289 Encounter for screening for malignant neoplasm of other sites: Secondary | ICD-10-CM

## 2010-11-21 LAB — HEPATIC FUNCTION PANEL
ALT: 34 U/L (ref 0–53)
AST: 32 U/L (ref 0–37)
Alkaline Phosphatase: 46 U/L (ref 39–117)
Bilirubin, Direct: 0.1 mg/dL (ref 0.0–0.3)
Total Bilirubin: 0.5 mg/dL (ref 0.3–1.2)
Total Protein: 6.4 g/dL (ref 6.0–8.3)

## 2010-11-21 LAB — LDL CHOLESTEROL, DIRECT: Direct LDL: 98.5 mg/dL

## 2010-11-24 ENCOUNTER — Encounter: Payer: Self-pay | Admitting: Internal Medicine

## 2010-11-25 ENCOUNTER — Ambulatory Visit (INDEPENDENT_AMBULATORY_CARE_PROVIDER_SITE_OTHER): Payer: 59 | Admitting: Internal Medicine

## 2010-11-25 DIAGNOSIS — M109 Gout, unspecified: Secondary | ICD-10-CM

## 2010-11-25 DIAGNOSIS — E663 Overweight: Secondary | ICD-10-CM

## 2010-11-25 DIAGNOSIS — E785 Hyperlipidemia, unspecified: Secondary | ICD-10-CM

## 2010-11-25 DIAGNOSIS — M79609 Pain in unspecified limb: Secondary | ICD-10-CM

## 2010-11-25 DIAGNOSIS — Z0000 Encounter for general adult medical examination without abnormal findings: Secondary | ICD-10-CM

## 2010-11-27 ENCOUNTER — Encounter: Payer: Self-pay | Admitting: Internal Medicine

## 2010-11-27 NOTE — Progress Notes (Signed)
Subjective:    Patient ID: Jeffrey Dougherty, male    DOB: 06/19/1961, 50 y.o.   MRN: [id number]  HPI [name] presents for general medical exam. In the interval since his last visit he has been diligent about taking his medications as well as paying closer attention to his diet. He has had no illness, injury or surgery. He does c/o severe pain in the great MTP joint right foot. At his last visit x-rays of the foot were read out with severe OA at the MTP joint 1st right. No evidence of gout related changes. The patient does report that he had seen Dr. Lajoyce Corners in the past for an unrelated problem who did comment that his toe pain may be OA.   Past Medical History  Diagnosis Date  . Anxiety state, unspecified   . Anal or rectal pain 01/2009  . Hyperlipidemia   . Pneumonia   . History of chickenpox   . Pneumonia, organism unspecified   . Prostatitis   . External otitis   . Temporomandibular joint disorders, unspecified   . Preglaucoma, unspecified   . Nonspecific elevation of levels of transaminase or lactic acid dehydrogenase (LDH)    Past Surgical History  Procedure Date  . Vasectomy   . Appendectomy   . Tracheotomy    Family History  Problem Relation Age of Onset  . Alcohol abuse Mother   . Hip fracture Mother   . Cancer Mother     breast  . Cancer Father     Prostate cancer  . Arthritis Father     rheumatoid  . Colon polyps Father   . Coronary artery disease Neg Hx   . Stroke Neg Hx    History   Social History  . Marital Status: Married    Spouse Name: N/A    Number of Children: N/A  . Years of Education: 16   Occupational History  . PARTS DIRECTOR    Social History Main Topics  . Smoking status: Former Games developer  . Smokeless tobacco: Former Neurosurgeon  . Alcohol Use: 12.6 oz/week    21 Cans of beer per week     3 beers daily  . Drug Use: No  . Sexually Active: Yes -- Male partner(s)   Other Topics Concern  . Not on file   Social History Narrative   HSG.  Married-'87.  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 . Marriage in good health.       Review of Systems Review of Systems  Constitutional:  Negative for fever, chills, activity change and unexpected weight change.  HENT:  Negative for hearing loss, ear pain, congestion, neck stiffness and postnasal drip.   Eyes: Negative for pain, discharge and visual disturbance.  Respiratory: Negative for chest tightness and wheezing.   Cardiovascular: Negative for chest pain and palpitations.       No decreased exercise tolerance Gastrointestinal: No change in bowel habit. No bloating or gas. No reflux or indigestion Genitourinary: Negative for urgency, frequency, flank pain and difficulty urinating.  Musculoskeletal: Negative for myalgias, back pain, arthralgias and gait problem. Has frequent pain right foot -at the great toe MCP. Neurological: Negative for dizziness, tremors, weakness and headaches.  Hematological: Negative for adenopathy.  Psychiatric/Behavioral: Negative for behavioral problems and dysphoric mood.       Objective:   Physical Exam Constitutional: He is oriented to person, place, and time. He appears well-developed and well-nourished.  Healthy appearing white male in no acute distress  HENT:  Head: Normocephalic and atraumatic.  Right Ear: External ear normal. EAC/TM nl Left Ear: External ear normal.  EAC/TM nl Nose: Nose normal.  Mouth/Throat: Oropharynx is clear and moist.  Eyes: Conjunctivae and EOM are normal. Pupils are equal, round, and reactive to light. Right eye exhibits no discharge. Left eye exhibits no discharge. No scleral icterus.  Neck: Normal range of motion. Neck supple. No JVD present. No tracheal deviation present. No thyromegaly present.  Cardiovascular: Normal rate, regular rhythm and normal heart sounds.  Exam reveals no gallop and no friction rub.   No murmur heard.       Quiet precordium. 2+ radial and DP pulses  Pulmonary/Chest: Effort normal. No respiratory distress. He has no wheezes. He has no rales. He exhibits no tenderness.       No chest wall deformity  Abdominal: Soft. Bowel sounds are normal. He exhibits no distension. There is no tenderness. There is no rebound and no guarding.       No heptosplenomegaly  Genitourinary: Deferred to normal PSA  Musculoskeletal: Normal range of motion. He exhibits no edema and no tenderness.       Small and large joints without redness, synovial thickening or deformity. Full range of motion preserved about all small, median and large joints. Right great MCP enlarged but not particularly tender on exam. Lymphadenopathy:    He has no cervical adenopathy.  Neurological: He is alert and oriented to person, place, and time. He has normal reflexes. No cranial nerve deficit. Coordination normal.  Skin: Skin is warm and dry. No rash noted. No erythema.  Psychiatric: He has a normal mood and affect. His behavior is normal. Thought content normal.           Assessment & Plan:  1. Hyperlipidemia - patient taking medication as directed with excellent results: cholesterol 187, HDL 48.9, LDL 98.5 - at goal!! Tryglycerides 332. Liver functions were normal  Plan - excellent adherence to regimen - will continue the same.  2. Foot pain - advanced OA at 1st right MTP which does interfere with is gait.  Plan - return to Dr. Lajoyce Corners for consultation - joint reconstruction.  3. Gout - No recent flares of gout. Last uric acid level Nov '11 8.7. Presently on allopurinol at 100mg  daily.  Pan - continue present dose of allopurinol unless he has recurrent flare(s)  4. Weight management - he is aware of his weight being above goal.   Plan - weight loss via smart food choices, portion size control and aerobic exercise. He plans to loose 15 lbs before his fall dove hunting trip.  5. Health maintenance - interval history as noted. Labs  from Nov '11 and May '12 reviewed and are normal. He is current with colorectal cancer screening with last study August '10, due for follow-up '20. His PSA is normal 0.55. Immunizations: due for tetanus.   In summary - a very nice man who is medically stable. He will set up his own appointment with Dr. Lajoyce Corners. He will continue all his present medication. He will return as needed or in 1 year.

## 2011-01-25 ENCOUNTER — Telehealth: Payer: Self-pay | Admitting: *Deleted

## 2011-01-25 NOTE — Telephone Encounter (Signed)
Ok for refill with 2 additional

## 2011-01-25 NOTE — Telephone Encounter (Signed)
Fax from [facility] [address] Alprazolam 0.5 mg sig take one tablet by mouth every 6 hours prn. Qty 30 last fill was 10/21/2010 please Advise refills

## 2011-01-26 MED ORDER — ALPRAZOLAM 0.5 MG PO TABS: 0.5000 mg | ORAL_TABLET | Freq: Four times a day (QID) | ORAL | Status: DC | PRN

## 2011-03-21 ENCOUNTER — Other Ambulatory Visit: Payer: Self-pay | Admitting: Internal Medicine

## 2011-07-06 ENCOUNTER — Other Ambulatory Visit: Payer: Self-pay | Admitting: *Deleted

## 2011-07-06 MED ORDER — LIDOCAINE-HYDROCORTISONE ACE 3-0.5 % RE CREA: 1.0000 | TOPICAL_CREAM | RECTAL | Status: DC | PRN

## 2011-07-06 NOTE — Telephone Encounter (Signed)
Pt needs refill for Hemorrhoid cream sent to [facility] in [place], pt is currently working out of town. Rx sent to pharmacy pt requesting, pt informed.

## 2011-08-28 ENCOUNTER — Encounter: Payer: Self-pay | Admitting: Internal Medicine

## 2011-08-28 ENCOUNTER — Ambulatory Visit (INDEPENDENT_AMBULATORY_CARE_PROVIDER_SITE_OTHER): Payer: 59 | Admitting: Internal Medicine

## 2011-08-28 DIAGNOSIS — F411 Generalized anxiety disorder: Secondary | ICD-10-CM

## 2011-08-28 DIAGNOSIS — M5136 Other intervertebral disc degeneration, lumbar region: Secondary | ICD-10-CM

## 2011-08-28 DIAGNOSIS — M5137 Other intervertebral disc degeneration, lumbosacral region: Secondary | ICD-10-CM

## 2011-08-28 MED ORDER — ALPRAZOLAM 0.5 MG PO TABS: 0.5000 mg | ORAL_TABLET | Freq: Four times a day (QID) | ORAL | Status: DC | PRN

## 2011-08-28 NOTE — Progress Notes (Signed)
  Subjective:    Patient ID: Jeffrey Dougherty, male    DOB: 09-03-1960, 51 y.o.   MRN: [id number]  HPI Jeffrey Dougherty has sharp pain from left flank with radiates to left groin. He is able to walk without difficulty and has full ROM hips. He has a history of back issues and has had ESI per Dr. Alvester Morin at Dr. Audrie Lia office which did give him relief. He is concerned that he may have a hernia. He has otherwise been ok: working hard, on the road a lot, not able to exercise on a regular basis.  PMH, FamHx and SocHx reviewed for any changes and relevance.    Review of Systems System review is negative for any constitutional, cardiac, pulmonary, GI or neuro symptoms or complaints other than as described in the HPI.     Objective:   Physical Exam Filed Vitals:   08/28/11 1039  BP: 92/62  Pulse: 72  Temp: 97.9 F (36.6 C)  Resp: 16   Gen'l - WNWD white man in no distress Back exam: normal stand; normal flex to greater than 100 degrees; normal gait; normal toe/heel walk; normal step up to exam table; normal SLR sitting; normal DTRs at the patellar tendons; no  CVA tenderness; able to move supine to sitting witout assistance. Abd - no palpable bulge left lower abdomen, left inguinal canal exam - no hernia        Assessment & Plan:

## 2011-08-29 DIAGNOSIS — M5136 Other intervertebral disc degeneration, lumbar region: Secondary | ICD-10-CM | POA: Insufficient documentation

## 2011-08-29 NOTE — Assessment & Plan Note (Signed)
Occasional problem, usually associated with flying  Plan - renewed Alprazolam

## 2011-08-29 NOTE — Assessment & Plan Note (Signed)
Exam reveals normal hip movement. Suspect pain is radicular from lumbar DDD  Plan - return to Dr. Alvester Morin if problem gets worse           Encouraged to do DAILY back stretches and exercise

## 2011-09-17 ENCOUNTER — Other Ambulatory Visit: Payer: Self-pay | Admitting: Internal Medicine

## 2011-09-18 NOTE — Telephone Encounter (Signed)
Done

## 2011-12-04 ENCOUNTER — Encounter: Payer: 59 | Admitting: Internal Medicine

## 2012-01-24 ENCOUNTER — Encounter: Payer: 59 | Admitting: Internal Medicine

## 2012-02-28 ENCOUNTER — Other Ambulatory Visit: Payer: Self-pay | Admitting: *Deleted

## 2012-02-28 MED ORDER — ALPRAZOLAM 0.5 MG PO TABS: 0.5000 mg | ORAL_TABLET | Freq: Four times a day (QID) | ORAL | Status: DC | PRN

## 2012-02-28 NOTE — Telephone Encounter (Signed)
Rx printed for alprazolam for Dr. Debby Bud to approve and to be faxed and [facility]

## 2012-03-27 ENCOUNTER — Ambulatory Visit (INDEPENDENT_AMBULATORY_CARE_PROVIDER_SITE_OTHER): Payer: 59 | Admitting: Internal Medicine

## 2012-03-27 ENCOUNTER — Encounter: Payer: Self-pay | Admitting: Internal Medicine

## 2012-03-27 ENCOUNTER — Other Ambulatory Visit (INDEPENDENT_AMBULATORY_CARE_PROVIDER_SITE_OTHER): Payer: 59

## 2012-03-27 ENCOUNTER — Other Ambulatory Visit: Payer: 59

## 2012-03-27 VITALS — BP 120/80 | HR 86 | Temp 98.0°F | Resp 16 | Ht 69.0 in | Wt 220.0 lb

## 2012-03-27 DIAGNOSIS — M109 Gout, unspecified: Secondary | ICD-10-CM

## 2012-03-27 DIAGNOSIS — M5137 Other intervertebral disc degeneration, lumbosacral region: Secondary | ICD-10-CM

## 2012-03-27 DIAGNOSIS — Z23 Encounter for immunization: Secondary | ICD-10-CM

## 2012-03-27 DIAGNOSIS — Z0000 Encounter for general adult medical examination without abnormal findings: Secondary | ICD-10-CM

## 2012-03-27 DIAGNOSIS — E785 Hyperlipidemia, unspecified: Secondary | ICD-10-CM

## 2012-03-27 DIAGNOSIS — E663 Overweight: Secondary | ICD-10-CM

## 2012-03-27 DIAGNOSIS — M5136 Other intervertebral disc degeneration, lumbar region: Secondary | ICD-10-CM

## 2012-03-27 LAB — COMPREHENSIVE METABOLIC PANEL
ALT: 31 U/L (ref 0–53)
AST: 28 U/L (ref 0–37)
Albumin: 4.1 g/dL (ref 3.5–5.2)
Alkaline Phosphatase: 60 U/L (ref 39–117)
Calcium: 9.5 mg/dL (ref 8.4–10.5)
Chloride: 103 mEq/L (ref 96–112)
Potassium: 4 mEq/L (ref 3.5–5.1)

## 2012-03-27 LAB — LIPID PANEL
HDL: 42.3 mg/dL (ref >39.00)
Total CHOL/HDL Ratio: 5
VLDL: 103 mg/dL — ABNORMAL HIGH (ref 0.0–40.0)

## 2012-03-27 LAB — LDL CHOLESTEROL, DIRECT: Direct LDL: 107.8 mg/dL

## 2012-03-27 LAB — HEPATIC FUNCTION PANEL: AST: 28 U/L (ref 0–37)

## 2012-03-27 NOTE — Patient Instructions (Addendum)
Goal for weight - 190 - BMI 27. Rules for weight management  - smart food, PORTION SIZE CONTROL, exercise. Goal is to loose 1 -2 lbs/month.  Read "Younger Next Year for Men"  Keep up the exericse: treadmill, etc.  Tdap today good for 10 years.  Purine Restricted Diet A low-purine diet consists of foods that reduce uric acid made in your body. INDICATIONS FOR USE   Your caregiver may ask you to follow a low-purine diet to reduce gout flairs.   GUIDELINES   Avoid high-purine foods, including all alcohol, yeast extracts taken as supplements, and sauces made from meats (like gravy). Do not eat high-purine meats, including anchovies, sardines, herring, mussels, tuna, codfish, scallops, trout, haddock, bacon, organ meats, tripe, goose, wild game, and sweetbreads.   Grains  Allowed/Recommended: All, except those listed to consume in moderation.   Consume in Moderation: Oatmeal (? cup uncooked daily), wheat bran or germ ( cup daily), and whole grains.  Vegetables  Allowed/Recommended: All, except those listed to consume in moderation.   Consume in Moderation: Asparagus, cauliflower, spinach, mushrooms, and green peas ( cup daily).  Fruit  Allowed/Recommended: All.   Consume in Moderation: None.  Meat and Meat Substitutes  Allowed/Recommended: Eggs, nuts, and peanut butter.   Consume in Moderation: Limit to 4 to 6 oz daily. Avoid high-purine meats. Lentils, peas, and dried beans (1 cup daily).  Milk  Allowed/Recommended: All. Choose low-fat or skim when possible.   Consume in Moderation: None.  Fats and Oils  Allowed/Recommended: All.   Consume in Moderation: None.  Beverages  Allowed/Recommended: All, except those listed to avoid.   Avoid: All alcohol.  Condiments/Miscellaneous  Allowed/Recommended: All, except those listed to consume in moderation.   Consume in Moderation: Bouillon and meat-based broths and soups.  Document Released: 10/14/2010 Document Revised:  06/08/2011 Document Reviewed: 10/14/2010 [facility] Patient Information 2012 [place], [place].

## 2012-03-27 NOTE — Progress Notes (Signed)
Subjective:    Patient ID: Jeffrey Dougherty, male    DOB: [birth year], 50 y.o.   MRN: [id number]  HPI Jeffrey Dougherty presents for annual medical wellness exam. He has continued to have some back problems but has been following recommendations from the book "Treat Your Own Back" and this has been helpful. He has otherwise been doing well.   Past Medical History  Diagnosis Date  . Anxiety state, unspecified   . Anal or rectal pain 01/2009  . Hyperlipidemia   . Pneumonia   . History of chickenpox   . Pneumonia, organism unspecified   . Prostatitis   . External otitis   . Temporomandibular joint disorders, unspecified   . Preglaucoma, unspecified   . Nonspecific elevation of levels of transaminase or lactic acid dehydrogenase (LDH)    Past Surgical History  Procedure Date  . Vasectomy   . Appendectomy   . Tracheotomy    Family History  Problem Relation Age of Onset  . Alcohol abuse Mother   . Hip fracture Mother   . Cancer Mother     breast  . Cancer Father     Prostate cancer  . Arthritis Father     rheumatoid  . Colon polyps Father   . Coronary artery disease Neg Hx   . Stroke Neg Hx    History   Social History  . Marital Status: Married    Spouse Name: N/A    Number of Children: N/A  . Years of Education: 16   Occupational History  . PARTS DIRECTOR    Social History Main Topics  . Smoking status: Former Games developer  . Smokeless tobacco: Former Neurosurgeon  . Alcohol Use: 12.6 oz/week    21 Cans of beer per week     3 beers daily  . Drug Use: No  . Sexually Active: Yes -- Male partner(s)   Other Topics Concern  . Not on file   Social History Narrative   HSG. Married-'87.  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 . Marriage in good health.    Current Outpatient Prescriptions on File Prior to Visit  Medication Sig Dispense Refill  . ALPRAZolam (XANAX) 0.5 MG tablet Take 1 tablet (0.5 mg total)  by mouth every 6 (six) hours as needed.  60 tablet  5  . CRESTOR 5 MG tablet TAKE 1 TABLET EVERY EVENING  90 tablet  1  . lidocaine-hydrocortisone (ANAMANTLE HC) 3-0.5 % CREA Place 1 Applicatorful rectally every 4 (four) hours as needed.  98 g  2  . allopurinol (ZYLOPRIM) 100 MG tablet Take 100 mg by mouth.            Review of Systems Constitutional:  Negative for fever, chills, activity change and unexpected weight change.  HEENT:  Negative for hearing loss, ear pain, congestion, neck stiffness and postnasal drip. Negative for sore throat or swallowing problems. Negative for dental complaints.   Eyes: Negative for vision loss or change in visual acuity.  Respiratory: Negative for chest tightness and wheezing. Negative for DOE.   Cardiovascular: Negative for chest pain or palpitations. No decreased exercise tolerance Gastrointestinal: No change in bowel habit. No bloating or gas. More reflux or indigestion Genitourinary: Negative for urgency, frequency, flank pain and difficulty urinating.  Musculoskeletal: Negative for myalgias, back pain, arthralgias and gait problem.  Neurological: Negative for dizziness, tremors, weakness and headaches.  Hematological: Negative for adenopathy.  Psychiatric/Behavioral: Negative for behavioral problems and  dysphoric mood.         Objective:   Physical Exam Filed Vitals:   03/27/12 1434  BP: 120/80  Pulse: 86  Temp: 98 F (36.7 C)  Resp: 16   Wt Readings from Last 3 Encounters:  03/27/12 220 lb (99.791 kg)  08/28/11 225 lb 12 oz (102.4 kg)  11/25/10 221 lb (100.245 kg)   Gen'l: Well nourished well developed white male in no acute distress  HEENT: Head: Normocephalic and atraumatic. Right Ear: External ear normal. EAC/TM nl. Left Ear: External ear normal.  EAC/TM nl. Nose: Nose normal. Mouth/Throat: Oropharynx is clear and moist. Dentition - native, in good repair. No buccal or palatal lesions. Posterior pharynx clear. Eyes: Conjunctivae and  sclera clear. EOM intact. Pupils are equal, round, and reactive to light. Right eye exhibits no discharge. Left eye exhibits no discharge. Neck: Normal range of motion. Neck supple. No JVD present. No tracheal deviation present. No thyromegaly present.  Cardiovascular: Normal rate, regular rhythm, no gallop, no friction rub, no murmur heard.      Quiet precordium. 2+ radial and DP pulses . No carotid bruits Pulmonary/Chest: Effort normal. No respiratory distress or increased WOB, no wheezes, no rales. No chest wall deformity or CVAT. Abdomen: Soft. Bowel sounds are normal in all quadrants. He exhibits no distension, no tenderness, no rebound or guarding, No heptosplenomegaly  Genitourinary:  deferred Musculoskeletal: Normal range of motion. He exhibits no edema and no tenderness.       Small and large joints without redness, synovial thickening or deformity. Full range of motion preserved about all small, median and large joints.  Lymphadenopathy:    He has no cervical or supraclavicular adenopathy.  Neurological: He is alert and oriented to person, place, and time. CN II-XII intact. DTRs 2+ and symmetrical biceps, radial and patellar tendons. Cerebellar function normal with no tremor, rigidity, normal gait and station.  Skin: Skin is warm and dry. No rash noted. No erythema.  Psychiatric: He has a normal mood and affect. His behavior is normal. Thought content normal.   Lab Results  Component Value Date   WBC 6.0 02/08/2009   HGB 15.7 02/08/2009   HCT 44.8 02/08/2009   PLT 172.0 02/08/2009   GLUCOSE 113* 03/27/2012   CHOL 211* 03/27/2012   TRIG 515.0 Triglyceride is over 400; calculations on Lipids are invalid.* 03/27/2012   HDL 42.30 03/27/2012   LDLDIRECT 107.8 03/27/2012        ALT 31 03/27/2012   AST 28 03/27/2012        NA 140 03/27/2012   K 4.0 03/27/2012   CL 103 03/27/2012   CREATININE 1.0 03/27/2012   BUN 14 03/27/2012   CO2 29 03/27/2012   TSH 3.03 02/08/2009   PSA 0.55 11/21/2010   HGBA1C  6.0 08/13/2007            Assessment & Plan:

## 2012-03-31 DIAGNOSIS — Z0000 Encounter for general adult medical examination without abnormal findings: Secondary | ICD-10-CM | POA: Insufficient documentation

## 2012-03-31 NOTE — Assessment & Plan Note (Signed)
Vitals - 1 value per visit 03/27/2012 08/28/2011 11/25/2010 09/02/2010 07/25/2010  BMI 32.47 36.45 35.69 36.01 34.4   Vitals - 1 value per visit 05/03/2010  BMI 35.85   Normal BMI 19-25  Plan - continue weight management to bring down BMI - smart food choices, portion size control and aerobic exercise.

## 2012-03-31 NOTE — Assessment & Plan Note (Signed)
Interval history notable for flares of back pain, otherwise no major illness, injury or surgery. Physical exam is normal. Labs - OK except for elevated triglycerides. He is current for colorectal cancer screening. Discussed pros and cons of prostate cancer screening (USPHCTF recommendations reviewed and ACU April '13 recommendations) and he defers evaluation at this time secondary to normal PSA last several years: 0.59 in '10, 0.55 in '12. Immunization Tdap brought up to date.  In summary - a nice man who works hard and needs to find time for regular exercise and needs to loose weight. He will return in 3-4 months for repeat lab in regard to elevated triglycerides.

## 2012-03-31 NOTE — Assessment & Plan Note (Signed)
HDL and LDL are OK. Triglycerides are high - need to be address due to atherogenesis but also risk of pancreatitis. Triglycerides are very diet sensitive.   Plan -  low fat diet  Recheck lab in 3-4 months (order entered - no appt needed)  If Triglycerides remain elevated will need to increase crestor or consider adding additional agent.

## 2012-03-31 NOTE — Assessment & Plan Note (Signed)
Nov 1, '11 - Uric Acid 8.7  Plan -  for 2+ episodes per 12 months of gout flare will benefit from allopurinol to lower uric acid  Low purine diet - see AVS section

## 2012-03-31 NOTE — Assessment & Plan Note (Signed)
Has had steroid injections in the past.  Plan - regular back exercises to reduce severity of symptoms and flares.

## 2012-05-01 ENCOUNTER — Other Ambulatory Visit: Payer: Self-pay | Admitting: *Deleted

## 2012-05-01 MED ORDER — ROSUVASTATIN CALCIUM 5 MG PO TABS: 5.0000 mg | ORAL_TABLET | Freq: Every day | ORAL | Status: DC

## 2012-07-02 ENCOUNTER — Other Ambulatory Visit (INDEPENDENT_AMBULATORY_CARE_PROVIDER_SITE_OTHER): Payer: 59

## 2012-07-02 DIAGNOSIS — E785 Hyperlipidemia, unspecified: Secondary | ICD-10-CM

## 2012-07-02 LAB — LIPID PANEL
Cholesterol: 184 mg/dL (ref 0–200)
HDL: 45.9 mg/dL (ref >39.00)
VLDL: 53.2 mg/dL — ABNORMAL HIGH (ref 0.0–40.0)

## 2012-07-03 ENCOUNTER — Encounter: Payer: Self-pay | Admitting: Internal Medicine

## 2012-08-19 ENCOUNTER — Other Ambulatory Visit: Payer: Self-pay | Admitting: *Deleted

## 2012-08-19 MED ORDER — ROSUVASTATIN CALCIUM 5 MG PO TABS: 5.0000 mg | ORAL_TABLET | Freq: Every day | ORAL | Status: DC

## 2012-12-23 ENCOUNTER — Telehealth: Payer: Self-pay | Admitting: Internal Medicine

## 2012-12-23 NOTE — Telephone Encounter (Signed)
Pt has a cyst on his wrist that started 2 mo ago.  It is getting painful.   Could he be worked in this week or next week.

## 2012-12-23 NOTE — Telephone Encounter (Signed)
OK to add on tomorrow - Tuesday or Wednesday

## 2012-12-24 ENCOUNTER — Encounter: Payer: Self-pay | Admitting: Internal Medicine

## 2012-12-24 ENCOUNTER — Ambulatory Visit (INDEPENDENT_AMBULATORY_CARE_PROVIDER_SITE_OTHER): Payer: 59 | Admitting: Internal Medicine

## 2012-12-24 VITALS — BP 114/80 | HR 60 | Temp 98.0°F | Ht 69.0 in | Wt 224.8 lb

## 2012-12-24 DIAGNOSIS — E785 Hyperlipidemia, unspecified: Secondary | ICD-10-CM

## 2012-12-24 MED ORDER — SIMVASTATIN 20 MG PO TABS: 20.0000 mg | ORAL_TABLET | Freq: Every day | ORAL | Status: DC

## 2012-12-24 NOTE — Telephone Encounter (Signed)
Appt at 2:00 today.

## 2012-12-24 NOTE — Assessment & Plan Note (Signed)
Last lipid panel 6 months ago was good. Crestor with increased $$$  Plan Change to simvastatin 20 mg daily  F/u lab 8 weeks

## 2012-12-24 NOTE — Progress Notes (Signed)
  Subjective:    Patient ID: Jeffrey Dougherty, male    DOB: 05/06/1961, 52 y.o.   MRN: [id number]  HPI Cyst on the palmer aspect of right wrist at base of thumb. No report of injury, no pain, no limitation in activity. Nodule has not increased in size.  Query - substitute for crestor that is more cost effective. In the past he tried lipitor but was taken off due to discomfort. Lab review: no CK total on record. He does have increased back and leg stiffness when sitting a long time but no generalized myalgia.  PMH, FamHx and SocHx reviewed for any changes and relevance. Current Outpatient Prescriptions on File Prior to Visit  Medication Sig Dispense Refill  . ALPRAZolam (XANAX) 0.5 MG tablet Take 1 tablet (0.5 mg total) by mouth every 6 (six) hours as needed.  60 tablet  5  . lidocaine-hydrocortisone (ANAMANTLE HC) 3-0.5 % CREA Place 1 Applicatorful rectally every 4 (four) hours as needed.  98 g  2   No current facility-administered medications on file prior to visit.      Review of Systems System review is negative for any constitutional, cardiac, pulmonary, GI or neuro symptoms or complaints other than as described in the HPI.     Objective:   Physical Exam Filed Vitals:   12/24/12 1343  BP: 114/80  Pulse: 60  Temp: 98 F (36.7 C)   Cor 2+ radial Pulm - normal respirations MSK - 1.3 cm hard nodule palmer aspect right wrist at the base of the thumb. Mobile, non-tender.       Assessment & Plan:  Ganglion cyst - nodule on wrist is most c/w ganglion cyst. There is no pain and no limitation in function  Plan Watchful waiting. For pain, interference with function or enlargement will refer to hand surgeon for excision.

## 2012-12-24 NOTE — Patient Instructions (Addendum)
Cyst on the right wrist - this is most likely a ganglion cyst. No intervention is needed unless it is causing pain or limitation in activity.  Cholesterol - ok to switch to generic Zocor 20 mg. Report back if you have any increase in muscle pain. You will need follow up lab in 8 weeks: lipid panel and liver functions.

## 2012-12-31 ENCOUNTER — Other Ambulatory Visit: Payer: Self-pay | Admitting: Internal Medicine

## 2012-12-31 NOTE — Telephone Encounter (Signed)
Pt just seen 6.24.14 states he is having a gout flare in left foot.  He is requesting a Rx for Allopurinol.  Please advise

## 2012-12-31 NOTE — Telephone Encounter (Signed)
For acute flare of gout: colchine 0.6 mg - take two for gout pain, may take another at 1 hr - 24 hr limit 3 tabs, may repeat the next day if needed. Rx for 12 with 3 refills

## 2013-01-01 ENCOUNTER — Telehealth: Payer: Self-pay

## 2013-01-01 MED ORDER — COLCHICINE 0.6 MG PO TABS: ORAL_TABLET | ORAL | Status: DC

## 2013-01-01 NOTE — Telephone Encounter (Signed)
Patient notified rx has been sent to [facility] on [address]

## 2013-01-01 NOTE — Telephone Encounter (Signed)
Phone call from patient requesting a prescription for gout in his left big toe. He has allopurinol and been icing it and trying to keep in elevated but he's in his 3rd day of pain. [facility] on [address]. Please advise. Thanks.

## 2013-01-01 NOTE — Telephone Encounter (Signed)
Colchicine 0.6 mg: sig take 2 tabs for gout pain, may take 1 tab in 2 hrs if needed. Limit is 3 tabs/24 hrs. Can repeat regimen next day if needed. Rx for #6 with 5 refills

## 2013-01-06 ENCOUNTER — Encounter: Payer: Self-pay | Admitting: Internal Medicine

## 2013-01-06 ENCOUNTER — Other Ambulatory Visit: Payer: Self-pay | Admitting: Internal Medicine

## 2013-01-06 MED ORDER — ALPRAZOLAM 0.5 MG PO TABS: 0.5000 mg | ORAL_TABLET | Freq: Four times a day (QID) | ORAL | Status: DC | PRN

## 2013-02-20 ENCOUNTER — Encounter: Payer: Self-pay | Admitting: Internal Medicine

## 2013-02-20 ENCOUNTER — Ambulatory Visit (INDEPENDENT_AMBULATORY_CARE_PROVIDER_SITE_OTHER): Payer: 59 | Admitting: Internal Medicine

## 2013-02-20 VITALS — BP 112/68 | HR 86 | Temp 98.2°F | Ht 70.0 in | Wt 222.0 lb

## 2013-02-20 DIAGNOSIS — L0231 Cutaneous abscess of buttock: Secondary | ICD-10-CM | POA: Insufficient documentation

## 2013-02-20 DIAGNOSIS — L03317 Cellulitis of buttock: Secondary | ICD-10-CM | POA: Insufficient documentation

## 2013-02-20 MED ORDER — OXYCODONE HCL 5 MG PO TABA: 1.0000 | ORAL_TABLET | Freq: Four times a day (QID) | ORAL | Status: DC | PRN

## 2013-02-20 MED ORDER — OXYCODONE HCL 5 MG PO TABS: 5.0000 mg | ORAL_TABLET | ORAL | Status: DC | PRN

## 2013-02-20 NOTE — Patient Instructions (Signed)
Please continue the septra antibiotic as you started yesterday You can hold off on using the mupirocin ointment for now, although if you are told your culture is MRSA, you should use the ointment to both sides of the nose twice per day for 5 days You will be contacted regarding the referral for: general surgury Please take all new medication as prescribed - the pain medication

## 2013-02-20 NOTE — Progress Notes (Signed)
Subjective:    Patient ID: Jeffrey Dougherty, male    DOB: 05/06/1961, 52 y.o.   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  Has felt warm, but no high fever, chills, cough, CP, sob or dysuria.  Some pain seems to radiate to the right lateral hip area but not to groin or lower leg.  Did see urgent care yesterday out of town, tx with septra ds (taken 2 doses so far) and mupirocin ointment (has not used the latter), but pain too much to ignore today Past Medical History  Diagnosis Date  . Anxiety state, unspecified   . Anal or rectal pain 01/2009  . Hyperlipidemia   . Pneumonia   . History of chickenpox   . Pneumonia, organism unspecified   . Prostatitis   . External otitis   . Temporomandibular joint disorders, unspecified   . Preglaucoma, unspecified   . Nonspecific elevation of levels of transaminase or lactic acid dehydrogenase (LDH)    Past Surgical History  Procedure Laterality Date  . Vasectomy    . Appendectomy    . Tracheotomy      reports that he has quit smoking. He has quit using smokeless tobacco. He reports that he drinks about 12.6 ounces of alcohol per week. He reports that he does not use illicit drugs. family history includes Alcohol abuse in his mother; Arthritis in his father; Cancer in his father and mother; Colon polyps in his father; Hip fracture in his mother. There is no history of Coronary artery disease or Stroke. Allergies  Allergen Reactions  . Ciprofloxacin    Current Outpatient Prescriptions on File Prior to Visit  Medication Sig Dispense Refill  . allopurinol (ZYLOPRIM) 100 MG tablet TAKE 1 TABLET EVERY DAY FOR 10 DAYS 2 TABS ONCE DAILY X10 DAYS THEN 3 TABS ONCE DAILY X10 DAYS  100 tablet  0  . ALPRAZolam (XANAX) 0.5  MG tablet Take 1 tablet (0.5 mg total) by mouth every 6 (six) hours as needed.  60 tablet  5  . colchicine 0.6 MG tablet TAKE 2 TABLETS FOR GOUT PAIN, MAY TAKE 1 TAB IN 2 HOURS IF NEEDED. LIMIT IS 3 TABS IN 24 HOURS. CAN REPEAT REGIMEN THE NEXT DAY IF NEEDED.  6 tablet  5  . lidocaine-hydrocortisone (ANAMANTLE HC) 3-0.5 % CREA Place 1 Applicatorful rectally every 4 (four) hours as needed.  98 g  2  . simvastatin (ZOCOR) 20 MG tablet Take 1 tablet (20 mg total) by mouth at bedtime.  90 tablet  3   No current facility-administered medications on file prior to visit.   Review of Systems All otherwise neg per pt     Objective:   Physical Exam BP 112/68  Pulse 86  Temp(Src) 98.2 F (36.8 C) (Oral)  Ht 5\' 10"  (1.778 m)  Wt 222 lb (100.699 kg)  BMI 31.85 kg/m2  SpO2 97% VS noted,  Constitutional: Pt appears well-developed and well-nourished.  HENT: Head: NCAT.  Right Ear: External ear normal.  Left Ear: External ear normal.  Eyes: Conjunctivae and EOM are normal. Pupils are equal, round, and reactive to light.  Neck: Normal range of motion. Neck supple.  Cardiovascular: Normal rate and regular rhythm.   Pulmonary/Chest: Effort normal and breath sounds  normal.  Neurological: Pt is alert. Not confused  Skin: right buttock with severe tender fluctuant area about right ischial tuberosity area, indurated at least 2.5 cm, with 4-5 cm surrounding erythema from center.  No red streaks or drainage.  Psychiatric: Pt behavior is normal. Thought content normal. mild nervous    Assessment & Plan:

## 2013-02-21 ENCOUNTER — Encounter (INDEPENDENT_AMBULATORY_CARE_PROVIDER_SITE_OTHER): Payer: Self-pay | Admitting: Surgery

## 2013-02-21 ENCOUNTER — Ambulatory Visit (INDEPENDENT_AMBULATORY_CARE_PROVIDER_SITE_OTHER): Payer: 59 | Admitting: Surgery

## 2013-02-21 VITALS — BP 129/84 | HR 72 | Temp 97.0°F | Resp 14 | Ht 70.0 in | Wt 221.0 lb

## 2013-02-21 DIAGNOSIS — L0231 Cutaneous abscess of buttock: Secondary | ICD-10-CM

## 2013-02-21 NOTE — Progress Notes (Signed)
Subjective:     Patient ID: Jeffrey Dougherty, male   DOB: Jul 03, 1961, 52 y.o.   MRN: [id number]  HPI This is a pleasant gentleman referred by Dr. Oliver Barre for evaluation of a right buttock abscess. The patient travels frequently and this just came up on the buttock.  He is currently on Bactrim. He has no previous history of abscesses  Review of Systems     Objective:   Physical Exam On exam, there is significant cellulitis and necrotic area on the right buttock. I prepped the area Betadine, excising the necrotic tissue with a scalpel and drained a large abscess cavity. I then packed this with gauze    Assessment:     Right buttock abscess     Plan:     Wound care searches were given. He will do wet-to-dry dressing changes twice daily. He'll continue the antibiotics. I will see him back in 4 days

## 2013-02-24 ENCOUNTER — Other Ambulatory Visit: Payer: Self-pay | Admitting: Internal Medicine

## 2013-02-25 ENCOUNTER — Ambulatory Visit (INDEPENDENT_AMBULATORY_CARE_PROVIDER_SITE_OTHER): Payer: 59 | Admitting: Surgery

## 2013-02-25 DIAGNOSIS — Z09 Encounter for follow-up examination after completed treatment for conditions other than malignant neoplasm: Secondary | ICD-10-CM

## 2013-02-25 NOTE — Progress Notes (Signed)
Subjective:     Patient ID: Jeffrey Dougherty, male   DOB: 04/27/1961, 52 y.o.   MRN: [id number]  HPI He is here for a followup visit status post I&D of a right buttock abscess. He still has some discomfort but is otherwise doing okay  Review of Systems     Objective:   Physical Exam There is still induration of the wound the erythema has progressed. I removed some fibrinous exudate bluntly and the wound is clean with no purulence    Assessment:     Improving buttock abscess     Plan:     He will continue the current wound care and finish the antibiotics. I will check it back again in one week

## 2013-03-04 ENCOUNTER — Encounter (INDEPENDENT_AMBULATORY_CARE_PROVIDER_SITE_OTHER): Payer: Self-pay | Admitting: Surgery

## 2013-03-04 ENCOUNTER — Ambulatory Visit (INDEPENDENT_AMBULATORY_CARE_PROVIDER_SITE_OTHER): Payer: 59 | Admitting: Surgery

## 2013-03-04 VITALS — BP 122/80 | HR 78 | Temp 98.2°F | Resp 18 | Ht 70.0 in | Wt 218.0 lb

## 2013-03-04 DIAGNOSIS — L0231 Cutaneous abscess of buttock: Secondary | ICD-10-CM

## 2013-03-04 NOTE — Progress Notes (Signed)
Subjective:     Patient ID: Jeffrey Dougherty, male   DOB: 1960/12/24, 52 y.o.   MRN: [id number]  HPI He is here for a visit for the wound check of the buttock abscess. He reports he is doing well and is only covering with dry dressing  Review of Systems     Objective:   Physical Exam On exam, the wound was healed    Assessment:     History of left buttock abscess     Plan:     I will see him back as needed

## 2013-03-24 ENCOUNTER — Other Ambulatory Visit (INDEPENDENT_AMBULATORY_CARE_PROVIDER_SITE_OTHER): Payer: 59

## 2013-03-24 DIAGNOSIS — E785 Hyperlipidemia, unspecified: Secondary | ICD-10-CM

## 2013-03-24 LAB — LIPID PANEL
Cholesterol: 214 mg/dL — ABNORMAL HIGH (ref 0–200)
Triglycerides: 262 mg/dL — ABNORMAL HIGH (ref 0.0–149.0)

## 2013-03-24 LAB — HEPATIC FUNCTION PANEL
ALT: 29 U/L (ref 0–53)
Total Bilirubin: 0.6 mg/dL (ref 0.3–1.2)
Total Protein: 7 g/dL (ref 6.0–8.3)

## 2013-03-31 ENCOUNTER — Ambulatory Visit (INDEPENDENT_AMBULATORY_CARE_PROVIDER_SITE_OTHER): Payer: 59 | Admitting: Internal Medicine

## 2013-03-31 ENCOUNTER — Encounter: Payer: Self-pay | Admitting: Internal Medicine

## 2013-03-31 VITALS — BP 98/70 | HR 68 | Temp 98.0°F | Ht 69.0 in | Wt 221.4 lb

## 2013-03-31 DIAGNOSIS — Z0000 Encounter for general adult medical examination without abnormal findings: Secondary | ICD-10-CM

## 2013-03-31 DIAGNOSIS — L0231 Cutaneous abscess of buttock: Secondary | ICD-10-CM

## 2013-03-31 DIAGNOSIS — E785 Hyperlipidemia, unspecified: Secondary | ICD-10-CM

## 2013-03-31 DIAGNOSIS — E663 Overweight: Secondary | ICD-10-CM

## 2013-03-31 DIAGNOSIS — F411 Generalized anxiety disorder: Secondary | ICD-10-CM

## 2013-03-31 DIAGNOSIS — M109 Gout, unspecified: Secondary | ICD-10-CM

## 2013-03-31 DIAGNOSIS — H40009 Preglaucoma, unspecified, unspecified eye: Secondary | ICD-10-CM

## 2013-03-31 MED ORDER — ALLOPURINOL 300 MG PO TABS: 300.0000 mg | ORAL_TABLET | Freq: Every day | ORAL | Status: DC

## 2013-03-31 NOTE — Patient Instructions (Addendum)
Thanks for coming in to see me.   You are doing well today. Your lab work is good although the LDL cholersterol did edge up a bit.   Gout flares - continue to take the allorpurinol. For gout flare it is OK to treat with a NSAID ,e g. advil max dose is 2,400 mg/day  Fitness - flex stretch becomes more important as we mature. Trying to do a regular program 3 days a week is adequate. Go to [employer] and search for flex-stretch  You are do for colonoscopy in August '15.   Have a good year.

## 2013-03-31 NOTE — Progress Notes (Signed)
Subjective:    Patient ID: Jeffrey Dougherty, male    DOB: 1960/07/11, 52 y.o.   MRN: [id number]  HPI Jeffrey Dougherty presents today for a general medical exam. In the interval he was treated, I&D, for an abscess of the buttock by Dr. Magnus Ivan. The abscess spontaneously appeared. He has done well.   He also has a cyst on the right wrist.   Had a recent flare of gout.   No flares of in regard to kidney stones.  Past Medical History  Diagnosis Date  . Anxiety state, unspecified   . Anal or rectal pain 01/2009  . Hyperlipidemia   . Pneumonia   . History of chickenpox   . Pneumonia, organism unspecified   . Prostatitis   . External otitis   . Temporomandibular joint disorders, unspecified   . Preglaucoma, unspecified   . Nonspecific elevation of levels of transaminase or lactic acid dehydrogenase (LDH)    Past Surgical History  Procedure Laterality Date  . Vasectomy    . Appendectomy    . Tracheotomy    . Tonsillectomy     Family History  Problem Relation Age of Onset  . Alcohol abuse Mother   . Hip fracture Mother   . Cancer Mother     breast  . Cancer Father     Prostate cancer  . Arthritis Father     rheumatoid  . Colon polyps Father   . Coronary artery disease Neg Hx   . Stroke Neg Hx    History   Social History  . Marital Status: Married    Spouse Name: N/A    Number of Children: N/A  . Years of Education: 16   Occupational History  . PARTS DIRECTOR    Social History Main Topics  . Smoking status: Current Some Day Smoker -- 1.00 packs/day    Types: Cigarettes  . Smokeless tobacco: Former Neurosurgeon  . Alcohol Use: 12.6 oz/week    21 Cans of beer per week     Comment: 3 beers daily  . Drug Use: No  . Sexual Activity: Yes    Partners: Female   Other Topics Concern  . Not on file   Social History Narrative   HSG. Married-'87.  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 . Marriage in good  health.          Current Outpatient Prescriptions on File Prior to Visit  Medication Sig Dispense Refill  . ALPRAZolam (XANAX) 0.5 MG tablet Take 1 tablet (0.5 mg total) by mouth every 6 (six) hours as needed.  60 tablet  5  . colchicine 0.6 MG tablet TAKE 2 TABLETS FOR GOUT PAIN, MAY TAKE 1 TAB IN 2 HOURS IF NEEDED. LIMIT IS 3 TABS IN 24 HOURS. CAN REPEAT REGIMEN THE NEXT DAY IF NEEDED.  6 tablet  5  . simvastatin (ZOCOR) 20 MG tablet Take 1 tablet (20 mg total) by mouth at bedtime.  90 tablet  3   No current facility-administered medications on file prior to visit.      Review of Systems System review is negative for any constitutional, cardiac, pulmonary, GI or neuro symptoms or complaints other than as described in the HPI.     Objective:   Physical Exam Filed Vitals:   03/31/13 0855  BP: 98/70  Pulse: 68  Temp: 98 F (36.7 C)   Wt Readings from Last 3 Encounters:  03/31/13 221 lb 6.4 oz (100.426  kg)  03/04/13 218 lb (98.884 kg)  02/21/13 221 lb (100.245 kg)   BP Readings from Last 3 Encounters:  03/31/13 98/70  03/04/13 122/80  02/21/13 129/84   Filed Vitals:   03/31/13 0855  BP: 98/70  Pulse: 68  Temp: 98 F (36.7 C)   Wt Readings from Last 3 Encounters:  03/31/13 221 lb 6.4 oz (100.426 kg)  03/04/13 218 lb (98.884 kg)  02/21/13 221 lb (100.245 kg)   Gen'l: Well nourished well developed white male in no acute distress  HEENT: Head: Normocephalic and atraumatic. Right Ear: External ear normal. EAC/TM nl. Left Ear: External ear normal.  EAC/TM nl. Nose: Nose normal. Mouth/Throat: Oropharynx is clear and moist. Dentition - native, in good repair. No buccal or palatal lesions. Posterior pharynx clear. Eyes: Conjunctivae and sclera clear. EOM intact. Pupils are equal, round, and reactive to light. Right eye exhibits no discharge. Left eye exhibits no discharge. Neck: Normal range of motion. Neck supple. No JVD present. No tracheal deviation present. No  thyromegaly present.  Cardiovascular: Normal rate, regular rhythm, no gallop, no friction rub, no murmur heard.      Quiet precordium. 2+ radial and DP pulses . No carotid bruits Pulmonary/Chest: Effort normal. No respiratory distress or increased WOB, no wheezes, no rales. No chest wall deformity or CVAT. Abdomen: Soft. Bowel sounds are normal in all quadrants. He exhibits no distension, no tenderness, no rebound or guarding, No heptosplenomegaly  Genitourinary:  deferred Musculoskeletal: Normal range of motion. He exhibits no edema and no tenderness.       Small and large joints without redness, synovial thickening or deformity. Full range of motion preserved about all small, median and large joints.  Lymphadenopathy:    He has no cervical or supraclavicular adenopathy.  Neurological: He is alert and oriented to person, place, and time. CN II-XII intact. DTRs 2+ and symmetrical biceps, radial and patellar tendons. Cerebellar function normal with no tremor, rigidity, normal gait and station.  Skin: Skin is warm and dry. No rash noted. No erythema. Well healed but still a little red site of abscess on the right buttock. Psychiatric: He has a normal mood and affect. His behavior is normal. Thought content normal.   Recent Results (from the past 2160 hour(s))  HEPATIC FUNCTION PANEL     Status: None   Collection Time    03/24/13  7:50 AM      Result Value Range   Total Bilirubin 0.6  0.3 - 1.2 mg/dL   Bilirubin, Direct 0.1  0.0 - 0.3 mg/dL   Alkaline Phosphatase 53  39 - 117 U/L   AST 23  0 - 37 U/L   ALT 29  0 - 53 U/L   Total Protein 7.0  6.0 - 8.3 g/dL   Albumin 3.9  3.5 - 5.2 g/dL  LIPID PANEL     Status: Abnormal   Collection Time    03/24/13  7:50 AM      Result Value Range   Cholesterol 214 (*) 0 - 200 mg/dL   Comment: ATP III Classification       Desirable:  < 200 mg/dL               Borderline High:  200 - 239 mg/dL          High:  > = 045 mg/dL   Triglycerides 409.8 (*) 0.0 -  149.0 mg/dL   Comment: Normal:  <119 mg/dLBorderline High:  150 - 199 mg/dL  HDL 41.40  >39.00 mg/dL   VLDL 04.5 (*) 0.0 - 40.9 mg/dL   Total CHOL/HDL Ratio 5     Comment:                Men          Women1/2 Average Risk     3.4          3.3Average Risk          5.0          4.42X Average Risk          9.6          7.13X Average Risk          15.0          11.0                      LDL CHOLESTEROL, DIRECT     Status: None   Collection Time    03/24/13  7:50 AM      Result Value Range   Direct LDL 124.6     Comment: Optimal:  <100 mg/dLNear or Above Optimal:  100-129 mg/dLBorderline High:  130-159 mg/dLHigh:  160-189 mg/dLVery High:  >190 mg/dL         Assessment & Plan:

## 2013-04-01 NOTE — Assessment & Plan Note (Signed)
During the past month he has had a flare of gout. Colchicine does relieve his pain but causes stomach problems  Plan Continue allopurinol at 300 mg q Day  For flare may use NSAID

## 2013-04-01 NOTE — Assessment & Plan Note (Signed)
Lab revealed an LDL better than goal of 130, HDL close to goal of 40+, liver functions are normal.  Plan Continue present medication  Work on life-style with low fat diet.

## 2013-04-01 NOTE — Assessment & Plan Note (Signed)
He is current with his ophthalmologist

## 2013-04-01 NOTE — Assessment & Plan Note (Signed)
Body mass index is 32.68 kg/(m^2).  No significant change in weight - remains in obesity I category  Plan Better adherence to calorie reduced diet.

## 2013-04-01 NOTE — Assessment & Plan Note (Signed)
Interval history notable for gout flare and abscess right buttock. He generally is doing well continuing his work and hunting activities. Physical exam is unremarkable. He is current with colorectal cancer screening and last PSA in '12 was normal. Immunization - up to date with Tdap.  In summary A nice man who is medically stable at this time. He needs to reduce his weight with a calorie restricted diet and regular exercise.

## 2013-04-01 NOTE — Assessment & Plan Note (Signed)
Very small residual area 99% healed right buttock.

## 2013-04-01 NOTE — Assessment & Plan Note (Signed)
Well controlled problem with prn use of alprazolam

## 2013-04-11 ENCOUNTER — Other Ambulatory Visit: Payer: Self-pay | Admitting: Internal Medicine

## 2013-05-08 ENCOUNTER — Other Ambulatory Visit: Payer: Self-pay

## 2013-08-23 ENCOUNTER — Other Ambulatory Visit: Payer: Self-pay | Admitting: Internal Medicine

## 2013-12-27 ENCOUNTER — Emergency Department (HOSPITAL_COMMUNITY)
Admission: EM | Admit: 2013-12-27 | Discharge: 2013-12-27 | Disposition: A | Discharge status: Home / Self Care | Payer: 59 | Attending: Emergency Medicine | Admitting: Emergency Medicine

## 2013-12-27 ENCOUNTER — Encounter (HOSPITAL_COMMUNITY): Payer: Self-pay | Admitting: Emergency Medicine

## 2013-12-27 DIAGNOSIS — N201 Calculus of ureter: Secondary | ICD-10-CM | POA: Insufficient documentation

## 2013-12-27 DIAGNOSIS — Z8619 Personal history of other infectious and parasitic diseases: Secondary | ICD-10-CM | POA: Insufficient documentation

## 2013-12-27 DIAGNOSIS — Z8659 Personal history of other mental and behavioral disorders: Secondary | ICD-10-CM | POA: Insufficient documentation

## 2013-12-27 DIAGNOSIS — Z8669 Personal history of other diseases of the nervous system and sense organs: Secondary | ICD-10-CM | POA: Insufficient documentation

## 2013-12-27 DIAGNOSIS — F17200 Nicotine dependence, unspecified, uncomplicated: Secondary | ICD-10-CM | POA: Insufficient documentation

## 2013-12-27 DIAGNOSIS — Z8701 Personal history of pneumonia (recurrent): Secondary | ICD-10-CM | POA: Insufficient documentation

## 2013-12-27 DIAGNOSIS — E785 Hyperlipidemia, unspecified: Secondary | ICD-10-CM | POA: Insufficient documentation

## 2013-12-27 DIAGNOSIS — N23 Unspecified renal colic: Secondary | ICD-10-CM

## 2013-12-27 DIAGNOSIS — Z79899 Other long term (current) drug therapy: Secondary | ICD-10-CM | POA: Insufficient documentation

## 2013-12-27 DIAGNOSIS — Z8719 Personal history of other diseases of the digestive system: Secondary | ICD-10-CM | POA: Insufficient documentation

## 2013-12-27 LAB — COMPREHENSIVE METABOLIC PANEL
ALBUMIN: 4.1 g/dL (ref 3.5–5.2)
ALK PHOS: 58 U/L (ref 39–117)
ALT: 27 U/L (ref 0–53)
AST: 24 U/L (ref 0–37)
BILIRUBIN TOTAL: 0.4 mg/dL (ref 0.3–1.2)
BUN: 17 mg/dL (ref 6–23)
CHLORIDE: 99 meq/L (ref 96–112)
CO2: 20 meq/L (ref 19–32)
CREATININE: 0.84 mg/dL (ref 0.50–1.35)
Calcium: 9.8 mg/dL (ref 8.4–10.5)
GFR calc Af Amer: >90 mL/min (ref >90)
Glucose, Bld: 136 mg/dL — ABNORMAL HIGH (ref 70–99)
POTASSIUM: 4.6 meq/L (ref 3.7–5.3)
SODIUM: 135 meq/L — AB (ref 137–147)
Total Protein: 7 g/dL (ref 6.0–8.3)

## 2013-12-27 LAB — URINE MICROSCOPIC-ADD ON

## 2013-12-27 LAB — CBC
HCT: 43.6 % (ref 39.0–52.0)
Hemoglobin: 15.4 g/dL (ref 13.0–17.0)
MCH: 32.8 pg (ref 26.0–34.0)
MCHC: 35.3 g/dL (ref 30.0–36.0)
MCV: 93 fL (ref 78.0–100.0)
PLATELETS: 183 10*3/uL (ref 150–400)
RBC: 4.69 MIL/uL (ref 4.22–5.81)
RDW: 12.6 % (ref 11.5–15.5)
WBC: 9.2 10*3/uL (ref 4.0–10.5)

## 2013-12-27 LAB — URINALYSIS, ROUTINE W REFLEX MICROSCOPIC
BILIRUBIN URINE: NEGATIVE
GLUCOSE, UA: NEGATIVE mg/dL
Ketones, ur: NEGATIVE mg/dL
Leukocytes, UA: NEGATIVE
Nitrite: NEGATIVE
PROTEIN: NEGATIVE mg/dL
Specific Gravity, Urine: 1.015 (ref 1.005–1.030)
Urobilinogen, UA: 0.2 mg/dL (ref 0.0–1.0)
pH: 5 (ref 5.0–8.0)

## 2013-12-27 MED ORDER — HYDROMORPHONE HCL PF 1 MG/ML IJ SOLN
1.0000 mg | INTRAMUSCULAR | Status: DC | PRN
  Administered 2013-12-27: 1 mg via INTRAVENOUS
  Filled 2013-12-27: qty 1

## 2013-12-27 MED ORDER — TAMSULOSIN HCL 0.4 MG PO CAPS: 0.4000 mg | ORAL_CAPSULE | Freq: Every day | ORAL | Status: DC

## 2013-12-27 MED ORDER — ONDANSETRON HCL 4 MG PO TABS: 4.0000 mg | ORAL_TABLET | Freq: Three times a day (TID) | ORAL | Status: DC | PRN

## 2013-12-27 MED ORDER — KETOROLAC TROMETHAMINE 30 MG/ML IJ SOLN
30.0000 mg | Freq: Once | INTRAMUSCULAR | Status: AC
  Administered 2013-12-27: 30 mg via INTRAVENOUS
  Filled 2013-12-27: qty 1

## 2013-12-27 MED ORDER — OXYCODONE HCL 5 MG PO CAPS: 5.0000 mg | ORAL_CAPSULE | Freq: Four times a day (QID) | ORAL | Status: DC | PRN

## 2013-12-27 MED ORDER — ONDANSETRON HCL 4 MG/2ML IJ SOLN
4.0000 mg | Freq: Four times a day (QID) | INTRAMUSCULAR | Status: DC | PRN
  Administered 2013-12-27: 4 mg via INTRAVENOUS
  Filled 2013-12-27: qty 2

## 2013-12-27 NOTE — ED Notes (Addendum)
Patient c/o left flank and left groin area. Patient also has urinary frequency. Patient has a history of kidney stones. Patient took an oxycodone at 0530 this AM.

## 2013-12-27 NOTE — ED Notes (Signed)
Pt reports left flank pain starting 0400 this am with frequency and dysuria. Pt denies GI compliant.

## 2013-12-27 NOTE — ED Provider Notes (Signed)
CSN: [id number]     Arrival date & time 12/27/13  7829 History  First MD Initiated Contact with Patient 12/27/13 [phone]     Chief Complaint  Patient presents with  . Nephrolithiasis   Patient is a 53 y.o. male presenting with abdominal pain. The history is provided by the patient.  Abdominal Pain Pain location:  L flank Pain quality: sharp   Pain radiates to:  Groin Pain severity:  Severe Onset quality:  Sudden Duration: Pain started this morning at around 4 am. Timing:  Constant Relieved by:  Nothing Ineffective treatments: He tried an old oxycodone that did not help at about 0530. Associated symptoms: dysuria and nausea   Associated symptoms: no fever and no vomiting   He has a history of prior kidney stones and this feels similar.  In the past the pain was down into his testicle though and this time it is more in his groin and left flank.  Past Medical History  Diagnosis Date  . Anxiety state, unspecified   . Anal or rectal pain 01/2009  . Hyperlipidemia   . Pneumonia   . History of chickenpox   . Pneumonia, organism unspecified   . Prostatitis   . External otitis   . Temporomandibular joint disorders, unspecified   . Preglaucoma, unspecified   . Nonspecific elevation of levels of transaminase or lactic acid dehydrogenase (LDH)    Past Surgical History  Procedure Laterality Date  . Vasectomy    . Appendectomy    . Tracheotomy    . Tonsillectomy     Family History  Problem Relation Age of Onset  . Alcohol abuse Mother   . Hip fracture Mother   . Cancer Mother     breast  . Cancer Father     Prostate cancer  . Arthritis Father     rheumatoid  . Colon polyps Father   . Coronary artery disease Neg Hx   . Stroke Neg Hx    History  Substance Use Topics  . Smoking status: Current Some Day Smoker -- 1.00 packs/day    Types: Cigarettes  . Smokeless tobacco: Former Systems developer  . Alcohol Use: Yes     Comment: 6 beers daily    Review of Systems  Constitutional:  Negative for fever.  Gastrointestinal: Positive for nausea and abdominal pain. Negative for vomiting.  Genitourinary: Positive for dysuria.  All other systems reviewed and are negative.     Allergies  Ciprofloxacin  Home Medications   Prior to Admission medications   Medication Sig Start Date End Date Taking? Authorizing Provider  simvastatin (ZOCOR) 20 MG tablet Take 20 mg by mouth every morning.   Yes Historical Provider, MD  ondansetron (ZOFRAN) 4 MG tablet Take 1 tablet (4 mg total) by mouth every 8 (eight) hours as needed for nausea or vomiting. 12/27/13   Dorie Rank, MD  oxycodone (OXY-IR) 5 MG capsule Take 1-2 capsules (5-10 mg total) by mouth every 6 (six) hours as needed for pain. 12/27/13   Dorie Rank, MD  tamsulosin (FLOMAX) 0.4 MG CAPS capsule Take 1 capsule (0.4 mg total) by mouth daily after supper. 12/27/13   Dorie Rank, MD   BP 115/78  Pulse 70  Temp(Src) 97.5 F (36.4 C) (Oral)  Resp 17  SpO2 96% Physical Exam  Nursing note and vitals reviewed. Constitutional: He appears well-developed and well-nourished. No distress.  HENT:  Head: Normocephalic and atraumatic.  Right Ear: External ear normal.  Left Ear: External ear normal.  Eyes:  Conjunctivae are normal. Right eye exhibits no discharge. Left eye exhibits no discharge. No scleral icterus.  Neck: Neck supple. No tracheal deviation present.  Cardiovascular: Normal rate, regular rhythm and intact distal pulses.   Pulmonary/Chest: Effort normal and breath sounds normal. No stridor. No respiratory distress. He has no wheezes. He has no rales.  Abdominal: Soft. Bowel sounds are normal. He exhibits no distension. There is no tenderness. There is CVA tenderness (left). There is no rebound and no guarding. No hernia.  Genitourinary:  No inguinal mass or tenderness bilaterally  Musculoskeletal: He exhibits no edema and no tenderness.  Neurological: He is alert. He has normal strength. No cranial nerve deficit (no facial  droop, extraocular movements intact, no slurred speech) or sensory deficit. He exhibits normal muscle tone. He displays no seizure activity. Coordination normal.  Skin: Skin is warm and dry. No rash noted.  Psychiatric: He has a normal mood and affect.    ED Course  Procedures  Labs Review Labs Reviewed  COMPREHENSIVE METABOLIC PANEL - Abnormal; Notable for the following:    Sodium 135 (*)    Glucose, Bld 136 (*)    All other components within normal limits  URINALYSIS, ROUTINE W REFLEX MICROSCOPIC - Abnormal; Notable for the following:    Hgb urine dipstick LARGE (*)    All other components within normal limits  URINE MICROSCOPIC-ADD ON - Abnormal; Notable for the following:    Casts HYALINE CASTS (*)    All other components within normal limits  CBC   Medications  HYDROmorphone (DILAUDID) injection 1 mg (1 mg Intravenous Given 12/27/13 0951)  ondansetron (ZOFRAN) injection 4 mg (4 mg Intravenous Given 12/27/13 0951)  ketorolac (TORADOL) 30 MG/ML injection 30 mg (30 mg Intravenous Given 12/27/13 1044)    MDM   Final diagnoses:  Ureteral colic   Likely recurrent renal colic.  Has history of prior kidney stone.  Hematuria noted on UA.  Pt is feeling better after treatment.  Discussed outpatient follow up.  If not improving may need imaging at that time.     Dorie Rank, MD 12/27/13 1051

## 2013-12-27 NOTE — ED Notes (Signed)
Pt reports will alert staff when he would like something else for pain.

## 2013-12-27 NOTE — Discharge Instructions (Signed)

## 2014-01-06 ENCOUNTER — Other Ambulatory Visit: Payer: Self-pay | Admitting: Internal Medicine

## 2014-01-30 ENCOUNTER — Encounter: Payer: Self-pay | Admitting: Gastroenterology

## 2014-03-03 ENCOUNTER — Other Ambulatory Visit: Payer: Self-pay | Admitting: *Deleted

## 2014-03-03 ENCOUNTER — Telehealth: Payer: Self-pay | Admitting: *Deleted

## 2014-03-03 MED ORDER — ALPRAZOLAM 0.5 MG PO TABS
0.5000 mg | ORAL_TABLET | Freq: Two times a day (BID) | ORAL | Status: DC | PRN
Start: 2014-03-03 — End: 2015-08-09

## 2014-03-03 MED ORDER — ALPRAZOLAM 0.5 MG PO TABS: 0.5000 mg | ORAL_TABLET | Freq: Four times a day (QID) | ORAL | Status: DC | PRN

## 2014-03-03 MED ORDER — SIMVASTATIN 20 MG PO TABS: 20.0000 mg | ORAL_TABLET | ORAL | Status: DC

## 2014-03-03 MED ORDER — ALLOPURINOL 300 MG PO TABS: 300.0000 mg | ORAL_TABLET | Freq: Every day | ORAL | Status: DC

## 2014-03-03 NOTE — Telephone Encounter (Signed)
Done hardcopy to [facility]  

## 2014-03-03 NOTE — Telephone Encounter (Signed)
Pt is wanting a refill on his alprazolam. He has an appt set up to see Dr. Doug Sou 04/15/14. Pls advise. On refill...Jeffrey Dougherty

## 2014-03-03 NOTE — Telephone Encounter (Signed)
Left msg on triage stating made new appt to see Dr. Doug Sou 04/13/14, but he is out of medications. Requesting refill to be sent to his pharmacy...Jeffrey Dougherty

## 2014-03-03 NOTE — Telephone Encounter (Signed)
Faxed hardcopy to [facility]

## 2014-03-17 ENCOUNTER — Other Ambulatory Visit: Payer: Self-pay | Admitting: Geriatric Medicine

## 2014-03-17 MED ORDER — SIMVASTATIN 20 MG PO TABS: 20.0000 mg | ORAL_TABLET | ORAL | Status: DC

## 2014-04-13 ENCOUNTER — Encounter: Payer: 59 | Admitting: Internal Medicine

## 2014-06-12 ENCOUNTER — Encounter: Payer: 59 | Admitting: Internal Medicine

## 2014-06-19 ENCOUNTER — Other Ambulatory Visit: Payer: Self-pay | Admitting: Internal Medicine

## 2014-08-03 ENCOUNTER — Encounter: Payer: Self-pay | Admitting: Internal Medicine

## 2014-08-04 ENCOUNTER — Telehealth: Payer: Self-pay | Admitting: Internal Medicine

## 2014-08-04 NOTE — Telephone Encounter (Signed)
Dr. Doug Sou, do you know anything about this?

## 2014-08-04 NOTE — Telephone Encounter (Signed)
He can get his labs done at the visit.

## 2014-08-04 NOTE — Telephone Encounter (Signed)
Patient is requesting for lab orders to be put in ASAP. He has been rescheduled for this Friday due to provider request. Please advise if labs can be done before. Patients travel schedule keeps him away fro long periods of time.

## 2014-08-05 NOTE — Telephone Encounter (Signed)
Spoke with patient and he will come in and have the labs done the day of his visit.

## 2014-08-07 ENCOUNTER — Encounter: Payer: Self-pay | Admitting: Internal Medicine

## 2014-08-07 ENCOUNTER — Ambulatory Visit (INDEPENDENT_AMBULATORY_CARE_PROVIDER_SITE_OTHER): Payer: 59 | Admitting: Internal Medicine

## 2014-08-07 VITALS — BP 120/64 | HR 75 | Temp 98.3°F | Resp 14 | Ht 69.0 in | Wt 223.6 lb

## 2014-08-07 DIAGNOSIS — M1A9XX0 Chronic gout, unspecified, without tophus (tophi): Secondary | ICD-10-CM

## 2014-08-07 DIAGNOSIS — E785 Hyperlipidemia, unspecified: Secondary | ICD-10-CM

## 2014-08-07 DIAGNOSIS — Z0000 Encounter for general adult medical examination without abnormal findings: Secondary | ICD-10-CM

## 2014-08-07 NOTE — Assessment & Plan Note (Signed)
On simvastatin, no side effects, continue and check lipid panel.

## 2014-08-07 NOTE — Progress Notes (Signed)
   Subjective:    Patient ID: Jeffrey Dougherty, male    DOB: 21-Aug-1960, 54 y.o.   MRN: [id number]  HPI The patient is a here for wellness. He has no new complaints but wants to get his blood work checked. He has PMH of gout (no flares recently, takes allopurinol occasionally), anxiety (job related, stable, rare xanax usage), hyperlipidemia (stable on zocor). He does not exercise as much as he should since he travels for his work.  PMH, [facility] reviewed and updated.   Review of Systems  Constitutional: Negative for chills, activity change, appetite change, fatigue and unexpected weight change.  HENT: Negative.   Eyes: Negative.   Respiratory: Negative for cough, chest tightness, shortness of breath and wheezing.   Cardiovascular: Negative for chest pain, palpitations and leg swelling.  Gastrointestinal: Negative for nausea, abdominal pain, diarrhea and abdominal distention.  Endocrine: Negative.   Musculoskeletal: Negative.   Skin: Negative.   Neurological: Negative.   Psychiatric/Behavioral: Negative.       Objective:   Physical Exam  Constitutional: He is oriented to person, place, and time. He appears well-developed and well-nourished.  HENT:  Head: Normocephalic and atraumatic.  Eyes: EOM are normal.  Neck: Normal range of motion.  Cardiovascular: Normal rate and regular rhythm.   Carotids without bruits  Pulmonary/Chest: Effort normal and breath sounds normal. No respiratory distress. He has no wheezes. He has no rales.  Abdominal: Soft. Bowel sounds are normal. He exhibits no distension. There is no tenderness.  Musculoskeletal: He exhibits no edema.  Neurological: He is alert and oriented to person, place, and time.  Skin: Skin is warm and dry.   Filed Vitals:   08/07/14 1303  BP: 120/64  Pulse: 75  Temp: 98.3 F (36.8 C)  TempSrc: Oral  Resp: 14  Height: 5\' 9"  (1.753 m)  Weight: 223 lb 9.6 oz (101.424 kg)  SpO2: 97%      Assessment & Plan:  Flu shot declined.

## 2014-08-07 NOTE — Assessment & Plan Note (Signed)
Check uric acid. Takes allopurinol occasionally without flare.

## 2014-08-07 NOTE — Patient Instructions (Signed)
We have ordered the blood work and you can get it drawn next time you are in town.   We will schedule the blood work before your visit next time so we can talk about it at the visit.   You are doing well and keep up the good work.   Health Maintenance A healthy lifestyle and preventative care can promote health and wellness.  Maintain regular health, dental, and eye exams.  Eat a healthy diet. Foods like vegetables, fruits, whole grains, low-fat dairy products, and lean protein foods contain the nutrients you need and are low in calories. Decrease your intake of foods high in solid fats, added sugars, and salt. Get information about a proper diet from your health care provider, if necessary.  Regular physical exercise is one of the most important things you can do for your health. Most adults should get at least 150 minutes of moderate-intensity exercise (any activity that increases your heart rate and causes you to sweat) each week. In addition, most adults need muscle-strengthening exercises on 2 or more days a week.   Maintain a healthy weight. The body mass index (BMI) is a screening tool to identify possible weight problems. It provides an estimate of body fat based on height and weight. Your health care provider can find your BMI and can help you achieve or maintain a healthy weight. For males 20 years and older:  A BMI below 18.5 is considered underweight.  A BMI of 18.5 to 24.9 is normal.  A BMI of 25 to 29.9 is considered overweight.  A BMI of 30 and above is considered obese.  Maintain normal blood lipids and cholesterol by exercising and minimizing your intake of saturated fat. Eat a balanced diet with plenty of fruits and vegetables. Blood tests for lipids and cholesterol should begin at age 15 and be repeated every 5 years. If your lipid or cholesterol levels are high, you are over age 55, or you are at high risk for heart disease, you may need your cholesterol levels checked  more frequently.Ongoing high lipid and cholesterol levels should be treated with medicines if diet and exercise are not working.  If you smoke, find out from your health care provider how to quit. If you do not use tobacco, do not start.  Lung cancer screening is recommended for adults aged 31-80 years who are at high risk for developing lung cancer because of a history of smoking. A yearly low-dose CT scan of the lungs is recommended for people who have at least a 30-pack-year history of smoking and are current smokers or have quit within the past 15 years. A pack year of smoking is smoking an average of 1 pack of cigarettes a day for 1 year (for example, a 30-pack-year history of smoking could mean smoking 1 pack a day for 30 years or 2 packs a day for 15 years). Yearly screening should continue until the smoker has stopped smoking for at least 15 years. Yearly screening should be stopped for people who develop a health problem that would prevent them from having lung cancer treatment.  If you choose to drink alcohol, do not have more than 2 drinks per day. One drink is considered to be 12 oz (360 mL) of beer, 5 oz (150 mL) of wine, or 1.5 oz (45 mL) of liquor.  Avoid the use of street drugs. Do not share needles with anyone. Ask for help if you need support or instructions about stopping the use of  drugs.  High blood pressure causes heart disease and increases the risk of stroke. Blood pressure should be checked at least every 1-2 years. Ongoing high blood pressure should be treated with medicines if weight loss and exercise are not effective.  If you are 68-35 years old, ask your health care provider if you should take aspirin to prevent heart disease.  Diabetes screening involves taking a blood sample to check your fasting blood sugar level. This should be done once every 3 years after age 66 if you are at a normal weight and without risk factors for diabetes. Testing should be considered at a  younger age or be carried out more frequently if you are overweight and have at least 1 risk factor for diabetes.  Colorectal cancer can be detected and often prevented. Most routine colorectal cancer screening begins at the age of 66 and continues through age 62. However, your health care provider may recommend screening at an earlier age if you have risk factors for colon cancer. On a yearly basis, your health care provider may provide home test kits to check for hidden blood in the stool. A small camera at the end of a tube may be used to directly examine the colon (sigmoidoscopy or colonoscopy) to detect the earliest forms of colorectal cancer. Talk to your health care provider about this at age 16 when routine screening begins. A direct exam of the colon should be repeated every 5-10 years through age 43, unless early forms of precancerous polyps or small growths are found.  People who are at an increased risk for hepatitis B should be screened for this virus. You are considered at high risk for hepatitis B if:  You were born in a country where hepatitis B occurs often. Talk with your health care provider about which countries are considered high risk.  Your parents were born in a high-risk country and you have not received a shot to protect against hepatitis B (hepatitis B vaccine).  You have HIV or AIDS.  You use needles to inject street drugs.  You live with, or have sex with, someone who has hepatitis B.  You are a man who has sex with other men (MSM).  You get hemodialysis treatment.  You take certain medicines for conditions like cancer, organ transplantation, and autoimmune conditions.  Hepatitis C blood testing is recommended for all people born from [birth year] through 1966 and any individual with known risk factors for hepatitis C.  Healthy men should no longer receive prostate-specific antigen (PSA) blood tests as part of routine cancer screening. Talk to your health care provider  about prostate cancer screening.  Testicular cancer screening is not recommended for adolescents or adult males who have no symptoms. Screening includes self-exam, a health care provider exam, and other screening tests. Consult with your health care provider about any symptoms you have or any concerns you have about testicular cancer.  Practice safe sex. Use condoms and avoid high-risk sexual practices to reduce the spread of sexually transmitted infections (STIs).  You should be screened for STIs, including gonorrhea and chlamydia if:  You are sexually active and are younger than 24 years.  You are older than 24 years, and your health care provider tells you that you are at risk for this type of infection.  Your sexual activity has changed since you were last screened, and you are at an increased risk for chlamydia or gonorrhea. Ask your health care provider if you are at risk.  If  you are at risk of being infected with HIV, it is recommended that you take a prescription medicine daily to prevent HIV infection. This is called pre-exposure prophylaxis (PrEP). You are considered at risk if:  You are a man who has sex with other men (MSM).  You are a heterosexual man who is sexually active with multiple partners.  You take drugs by injection.  You are sexually active with a partner who has HIV.  Talk with your health care provider about whether you are at high risk of being infected with HIV. If you choose to begin PrEP, you should first be tested for HIV. You should then be tested every 3 months for as long as you are taking PrEP.  Use sunscreen. Apply sunscreen liberally and repeatedly throughout the day. You should seek shade when your shadow is shorter than you. Protect yourself by wearing long sleeves, pants, a wide-brimmed hat, and sunglasses year round whenever you are outdoors.  Tell your health care provider of new moles or changes in moles, especially if there is a change in shape  or color. Also, tell your health care provider if a mole is larger than the size of a pencil eraser.  A one-time screening for abdominal aortic aneurysm (AAA) and surgical repair of large AAAs by ultrasound is recommended for men aged 94-75 years who are current or former smokers.  Stay current with your vaccines (immunizations). Document Released: 12/16/2007 Document Revised: 06/24/2013 Document Reviewed: 11/14/2010 [facility] Patient Information 2015 [place], [place]. This information is not intended to replace advice given to you by your health care provider. Make sure you discuss any questions you have with your health care provider.

## 2014-08-07 NOTE — Progress Notes (Signed)
Pre visit review using our clinic review tool, if applicable. No additional management support is needed unless otherwise documented below in the visit note. 

## 2014-08-07 NOTE — Assessment & Plan Note (Signed)
Due for repeat colonoscopy, Tdap up to date, declines flu shot.

## 2014-08-10 ENCOUNTER — Encounter: Payer: 59 | Admitting: Internal Medicine

## 2014-08-25 ENCOUNTER — Other Ambulatory Visit (INDEPENDENT_AMBULATORY_CARE_PROVIDER_SITE_OTHER): Payer: 59

## 2014-08-25 DIAGNOSIS — Z0000 Encounter for general adult medical examination without abnormal findings: Secondary | ICD-10-CM

## 2014-08-25 DIAGNOSIS — M1A9XX0 Chronic gout, unspecified, without tophus (tophi): Secondary | ICD-10-CM

## 2014-08-25 DIAGNOSIS — E785 Hyperlipidemia, unspecified: Secondary | ICD-10-CM

## 2014-08-25 LAB — COMPREHENSIVE METABOLIC PANEL
ALBUMIN: 4.1 g/dL (ref 3.5–5.2)
ALK PHOS: 52 U/L (ref 39–117)
ALT: 25 U/L (ref 0–53)
AST: 20 U/L (ref 0–37)
BUN: 16 mg/dL (ref 6–23)
CO2: 28 mEq/L (ref 19–32)
Calcium: 9.4 mg/dL (ref 8.4–10.5)
Chloride: 105 mEq/L (ref 96–112)
Creatinine, Ser: 0.89 mg/dL (ref 0.40–1.50)
GFR: 94.59 mL/min (ref >60.00)
Glucose, Bld: 136 mg/dL — ABNORMAL HIGH (ref 70–99)
POTASSIUM: 4.8 meq/L (ref 3.5–5.1)
SODIUM: 138 meq/L (ref 135–145)
TOTAL PROTEIN: 6.9 g/dL (ref 6.0–8.3)
Total Bilirubin: 0.4 mg/dL (ref 0.2–1.2)

## 2014-08-25 LAB — URIC ACID: URIC ACID, SERUM: 6.1 mg/dL (ref 4.0–7.8)

## 2014-08-25 LAB — LDL CHOLESTEROL, DIRECT: Direct LDL: 118 mg/dL

## 2014-08-25 LAB — LIPID PANEL
Cholesterol: 228 mg/dL — ABNORMAL HIGH (ref 0–200)
HDL: 41.4 mg/dL (ref >39.00)
Total CHOL/HDL Ratio: 6
Triglycerides: 470 mg/dL — ABNORMAL HIGH (ref 0.0–149.0)

## 2014-08-31 ENCOUNTER — Other Ambulatory Visit: Payer: Self-pay

## 2014-08-31 ENCOUNTER — Encounter: Payer: Self-pay | Admitting: Gastroenterology

## 2014-08-31 DIAGNOSIS — Z1211 Encounter for screening for malignant neoplasm of colon: Secondary | ICD-10-CM

## 2014-08-31 MED ORDER — ALLOPURINOL 300 MG PO TABS: 300.0000 mg | ORAL_TABLET | Freq: Every day | ORAL | Status: DC

## 2014-08-31 MED ORDER — SIMVASTATIN 20 MG PO TABS: 20.0000 mg | ORAL_TABLET | ORAL | Status: DC

## 2014-08-31 NOTE — Telephone Encounter (Signed)
Pt called. Results given.   Pt had a questions about whether or not he should have a colonoscopy done every 5 years. Family hx of colon cancer.

## 2014-08-31 NOTE — Telephone Encounter (Signed)
Referral for gastro to have colonoscopy entered.   Pt contacted and informed that he should hear something from gastro soon.

## 2014-08-31 NOTE — Addendum Note (Signed)
Addended by: Lowella Dandy on: 08/31/2014 11:12 AM   Modules accepted: Orders

## 2014-08-31 NOTE — Telephone Encounter (Signed)
He is due for colonoscopy yes.

## 2014-10-05 ENCOUNTER — Ambulatory Visit (AMBULATORY_SURGERY_CENTER): Payer: 59 | Admitting: *Deleted

## 2014-10-05 VITALS — Ht 70.0 in | Wt 222.0 lb

## 2014-10-05 DIAGNOSIS — Z86010 Personal history of colonic polyps: Secondary | ICD-10-CM

## 2014-10-05 MED ORDER — MOVIPREP 100 G PO SOLR: ORAL | Status: DC

## 2014-10-05 NOTE — Progress Notes (Signed)
Patient denies any allergies to eggs or soy. Patient denies any problems with anesthesia/sedation. Patient denies any oxygen use at home and does not take any diet/weight loss medications. EMMI education assisgned to patient on colonoscopy, this was explained and instructions given to patient. 

## 2014-10-21 ENCOUNTER — Encounter: Payer: Self-pay | Admitting: Gastroenterology

## 2014-10-21 ENCOUNTER — Ambulatory Visit (AMBULATORY_SURGERY_CENTER): Payer: 59 | Admitting: Gastroenterology

## 2014-10-21 VITALS — BP 108/79 | HR 64 | Temp 97.5°F | Resp 17 | Ht 70.0 in | Wt 222.0 lb

## 2014-10-21 DIAGNOSIS — D124 Benign neoplasm of descending colon: Secondary | ICD-10-CM | POA: Diagnosis not present

## 2014-10-21 DIAGNOSIS — K5730 Diverticulosis of large intestine without perforation or abscess without bleeding: Secondary | ICD-10-CM

## 2014-10-21 DIAGNOSIS — D123 Benign neoplasm of transverse colon: Secondary | ICD-10-CM

## 2014-10-21 DIAGNOSIS — Z86010 Personal history of colonic polyps: Secondary | ICD-10-CM

## 2014-10-21 MED ORDER — SODIUM CHLORIDE 0.9 % IV SOLN: 500.0000 mL | INTRAVENOUS | Status: DC

## 2014-10-21 NOTE — Patient Instructions (Addendum)
One of your biggest health concerns is your smoking.  This increases your risk for most cancers and serious cardiovascular diseases such as strokes, heart attacks.  You should try your best to stop.  If you need assistance, please contact your PCP or Smoking Cessation Class at [facility] [phone]) or [place] (1-800-QUIT-NOW).   Discharge instructions given. Handouts on polyps and diverticulosis. Resume previous medications. YOU HAD AN ENDOSCOPIC PROCEDURE TODAY AT [facility]:   Refer to the procedure report that was given to you for any specific questions about what was found during the examination.  If the procedure report does not answer your questions, please call your gastroenterologist to clarify.  If you requested that your care partner not be given the details of your procedure findings, then the procedure report has been included in a sealed envelope for you to review at your convenience later.  YOU SHOULD EXPECT: Some feelings of bloating in the abdomen. Passage of more gas than usual.  Walking can help get rid of the air that was put into your GI tract during the procedure and reduce the bloating. If you had a lower endoscopy (such as a colonoscopy or flexible sigmoidoscopy) you may notice spotting of blood in your stool or on the toilet paper. If you underwent a bowel prep for your procedure, you may not have a normal bowel movement for a few days.  Please Note:  You might notice some irritation and congestion in your nose or some drainage.  This is from the oxygen used during your procedure.  There is no need for concern and it should clear up in a day or so.  SYMPTOMS TO REPORT IMMEDIATELY:   Following lower endoscopy (colonoscopy or flexible sigmoidoscopy):  Excessive amounts of blood in the stool  Significant tenderness or worsening of abdominal pains  Swelling of the abdomen that is new, acute  Fever of 100F or higher   For urgent or  emergent issues, a gastroenterologist can be reached at any hour by calling [phone].   DIET: Your first meal following the procedure should be a small meal and then it is ok to progress to your normal diet. Heavy or fried foods are harder to digest and may make you feel nauseous or bloated.  Likewise, meals heavy in dairy and vegetables can increase bloating.  Drink plenty of fluids but you should avoid alcoholic beverages for 24 hours.  ACTIVITY:  You should plan to take it easy for the rest of today and you should NOT DRIVE or use heavy machinery until tomorrow (because of the sedation medicines used during the test).    FOLLOW UP: Our staff will call the number listed on your records the next business day following your procedure to check on you and address any questions or concerns that you may have regarding the information given to you following your procedure. If we do not reach you, we will leave a message.  However, if you are feeling well and you are not experiencing any problems, there is no need to return our call.  We will assume that you have returned to your regular daily activities without incident.  If any biopsies were taken you will be contacted by phone or by letter within the next 1-3 weeks.  Please call us at [phone] if you have not heard about the biopsies in 3 weeks.    SIGNATURES/CONFIDENTIALITY: You and/or your care partner have signed paperwork which will be entered into  your electronic medical record.  These signatures attest to the fact that that the information above on your After Visit Summary has been reviewed and is understood.  Full responsibility of the confidentiality of this discharge information lies with you and/or your care-partner.

## 2014-10-21 NOTE — Progress Notes (Signed)
Called to room to assist during endoscopic procedure.  Patient ID and intended procedure confirmed with present staff. Received instructions for my participation in the procedure from the performing physician.  

## 2014-10-21 NOTE — Op Note (Signed)
[facility]  [address] [place], [address]   COLONOSCOPY PROCEDURE REPORT  PATIENT: Jeffrey, Dougherty  MR#: [id number] BIRTHDATE: [birth year] , 54  yrs. old GENDER: male ENDOSCOPIST: Milus Banister, MD PROCEDURE DATE:  10/21/2014 PROCEDURE:   Colonoscopy with snare polypectomy and Colonoscopy, surveillance First Screening Colonoscopy - Avg.  risk and is 50 yrs.  old or older - No.  Prior Negative Screening - Now for repeat screening. N/A  History of Adenoma - Now for follow-up colonoscopy & has been > or = to 3 yrs.  Yes hx of adenoma.  Has been 3 or more years since last colonoscopy. ASA CLASS:   Class II INDICATIONS:Surveillance due to prior colonic neoplasia. MEDICATIONS: Monitored anesthesia care and Propofol 300 mg IV  DESCRIPTION OF PROCEDURE:   After the risks benefits and alternatives of the procedure were thoroughly explained, informed consent was obtained.  The digital rectal exam revealed no abnormalities of the rectum.   The LB [id number]  endoscope was introduced through the anus and advanced to the cecum, which was identified by both the appendix and ileocecal valve. No adverse events experienced.   The quality of the prep was excellent.  The instrument was then slowly withdrawn as the colon was fully examined.  COLON FINDINGS: Three sessile polyps were found, removed and sent to pathology. One was 1.4cm across, proximal transverse colon, removed with snare/cautery, sent to pathology (jar 1).  Two were 3-34mm across, located in transverse and descending segments, removed with cold snare, sent to pathology (jar 2). There was left sided diverticulosis. The examination was otherwise normal.  Retroflexed views revealed no abnormalities. The time to cecum = 1.3 Withdrawal time = 11.2   The scope was withdrawn and the procedure completed. COMPLICATIONS: There were no immediate complications.  ENDOSCOPIC IMPRESSION: Three polyps were found,  removed sent to pathology. Left sided diverticulosis Otherwise normal examination  RECOMMENDATIONS: If the polyp(s) removed today are proven to be adenomatous (pre-cancerous) polyps, you will need a colonoscopy in 3 years. Otherwise you should continue to follow colorectal cancer screening guidelines for "routine risk" patients with a colonoscopy in 10 years.  You will receive a letter within 1-2 weeks with the results of your biopsy as well as final recommendations.  Please call my office if you have not received a letter after 3 weeks.  eSigned:  Milus Banister, MD 10/21/2014 9:27 AM   cc: Dr. Doug Sou

## 2014-10-21 NOTE — Progress Notes (Signed)
Awake and alert, pleased with MAC, Report to RN

## 2014-10-22 ENCOUNTER — Telehealth: Payer: Self-pay | Admitting: *Deleted

## 2014-10-22 NOTE — Telephone Encounter (Signed)
No answer, left message to call if questions or concerns. 

## 2014-10-27 ENCOUNTER — Encounter: Payer: Self-pay | Admitting: Gastroenterology

## 2015-04-08 ENCOUNTER — Other Ambulatory Visit: Payer: Self-pay

## 2015-04-08 MED ORDER — SIMVASTATIN 20 MG PO TABS
20.0000 mg | ORAL_TABLET | ORAL | Status: DC
Start: 2015-04-08 — End: 2015-08-09

## 2015-08-09 ENCOUNTER — Encounter: Payer: Self-pay | Admitting: Internal Medicine

## 2015-08-09 ENCOUNTER — Ambulatory Visit (INDEPENDENT_AMBULATORY_CARE_PROVIDER_SITE_OTHER): Payer: Managed Care, Other (non HMO) | Admitting: Internal Medicine

## 2015-08-09 ENCOUNTER — Telehealth: Payer: Self-pay | Admitting: Internal Medicine

## 2015-08-09 VITALS — BP 118/62 | HR 73 | Temp 98.3°F | Resp 12 | Ht 69.0 in | Wt 228.8 lb

## 2015-08-09 DIAGNOSIS — E785 Hyperlipidemia, unspecified: Secondary | ICD-10-CM

## 2015-08-09 DIAGNOSIS — Z720 Tobacco use: Secondary | ICD-10-CM

## 2015-08-09 DIAGNOSIS — Z0000 Encounter for general adult medical examination without abnormal findings: Secondary | ICD-10-CM

## 2015-08-09 DIAGNOSIS — E669 Obesity, unspecified: Secondary | ICD-10-CM | POA: Diagnosis not present

## 2015-08-09 DIAGNOSIS — M10079 Idiopathic gout, unspecified ankle and foot: Secondary | ICD-10-CM | POA: Diagnosis not present

## 2015-08-09 DIAGNOSIS — M109 Gout, unspecified: Secondary | ICD-10-CM

## 2015-08-09 DIAGNOSIS — H9312 Tinnitus, left ear: Secondary | ICD-10-CM

## 2015-08-09 MED ORDER — HYDROCORTISONE 2.5 % RE CREA: 1.0000 "application " | TOPICAL_CREAM | Freq: Two times a day (BID) | RECTAL | Status: DC

## 2015-08-09 MED ORDER — ALPRAZOLAM 0.5 MG PO TABS: 0.5000 mg | ORAL_TABLET | Freq: Two times a day (BID) | ORAL | Status: DC | PRN

## 2015-08-09 MED ORDER — ALLOPURINOL 300 MG PO TABS: 300.0000 mg | ORAL_TABLET | Freq: Every day | ORAL | Status: DC

## 2015-08-09 MED ORDER — SIMVASTATIN 20 MG PO TABS: 20.0000 mg | ORAL_TABLET | ORAL | Status: DC

## 2015-08-09 NOTE — Progress Notes (Signed)
Pre visit review using our clinic review tool, if applicable. No additional management support is needed unless otherwise documented below in the visit note. 

## 2015-08-09 NOTE — Progress Notes (Signed)
   Subjective:    Patient ID: Jeffrey Dougherty, male    DOB: [birth year], 55 y.o.   MRN: [id number]  HPI The patient is a 55 YO man coming in for wellness. No new concerns. Some arthritis in his joints and family history of severe arthritis. No chest pains, SOB, abdominal pains.   PMH, [facility], social history reviewed and updated.   Review of Systems  Constitutional: Negative for chills, activity change, appetite change, fatigue and unexpected weight change.  HENT: Negative.   Eyes: Negative.   Respiratory: Negative for cough, chest tightness, shortness of breath and wheezing.   Cardiovascular: Negative for chest pain, palpitations and leg swelling.  Gastrointestinal: Negative for nausea, abdominal pain, diarrhea and abdominal distention.  Endocrine: Negative.   Musculoskeletal: Positive for arthralgias. Negative for myalgias, back pain and gait problem.  Skin: Negative.   Neurological: Negative.   Psychiatric/Behavioral: Negative.       Objective:   Physical Exam  Constitutional: He is oriented to person, place, and time. He appears well-developed and well-nourished.  HENT:  Head: Normocephalic and atraumatic.  Eyes: EOM are normal.  Neck: Normal range of motion.  Cardiovascular: Normal rate and regular rhythm.   Carotids without bruits  Pulmonary/Chest: Effort normal and breath sounds normal. No respiratory distress. He has no wheezes. He has no rales.  Abdominal: Soft. Bowel sounds are normal. He exhibits no distension. There is no tenderness.  Musculoskeletal: He exhibits no edema.  Neurological: He is alert and oriented to person, place, and time.  Skin: Skin is warm and dry.   Filed Vitals:   08/09/15 0902  BP: 118/62  Pulse: 73  Temp: 98.3 F (36.8 C)  TempSrc: Oral  Resp: 12  Height: 5\' 9"  (1.753 m)  Weight: 228 lb 12.8 oz (103.783 kg)  SpO2: 97%      Assessment & Plan:

## 2015-08-09 NOTE — Telephone Encounter (Signed)
Patient states Dr. Sharlet Salina wanted him to get a hearing test.  Please follow up with patient.  Patient would like to be referred as soon as possible.

## 2015-08-09 NOTE — Assessment & Plan Note (Signed)
Not able to quit right now, reminded him of the risks and harms of smoking on his health.

## 2015-08-09 NOTE — Assessment & Plan Note (Signed)
Colonoscopy done last year and due in 2019. Tdap up to date and declines flu shot today. Checking labs including hep c screening. Not exercising regularly and talked to him about smoking cessation.

## 2015-08-09 NOTE — Addendum Note (Signed)
Addended by: Pricilla Holm A on: 08/09/2015 10:01 AM   Modules accepted: Orders

## 2015-08-09 NOTE — Telephone Encounter (Signed)
Referral has been placed and patient is aware. 

## 2015-08-09 NOTE — Patient Instructions (Signed)
It is fine to keep taking the advil or aleve as needed for the joints.   We will get you the hearing evaluation to make sure the ringing in the ears is normal.   I have given some stretching exercises which might help the elbow.   Lateral Epicondylitis With Rehab Lateral epicondylitis involves inflammation and pain around the outer portion of the elbow. The pain is caused by inflammation of the tendons in the forearm that bring back (extend) the wrist. Lateral epicondylitis is also called tennis elbow, because it is very common in tennis players. However, it may occur in any individual who extends the wrist repetitively. If lateral epicondylitis is left untreated, it may become a chronic problem. SYMPTOMS   Pain, tenderness, and inflammation on the outer (lateral) side of the elbow.  Pain or weakness with gripping activities.  Pain that increases with wrist-twisting motions (playing tennis, using a screwdriver, opening a door or a jar).  Pain with lifting objects, including a coffee cup. CAUSES  Lateral epicondylitis is caused by inflammation of the tendons that extend the wrist. Causes of injury may include:  Repetitive stress and strain on the muscles and tendons that extend the wrist.  Sudden change in activity level or intensity.  Incorrect grip in racquet sports.  Incorrect grip size of racquet (often too large).  Incorrect hitting position or technique (usually backhand, leading with the elbow).  Using a racket that is too heavy. RISK INCREASES WITH:  Sports or occupations that require repetitive and/or strenuous forearm and wrist movements (tennis, squash, racquetball, carpentry).  Poor wrist and forearm strength and flexibility.  Failure to warm up properly before activity.  Resuming activity before healing, rehabilitation, and conditioning are complete. PREVENTION   Warm up and stretch properly before activity.  Maintain physical fitness:  Strength, flexibility,  and endurance.  Cardiovascular fitness.  Wear and use properly fitted equipment.  Learn and use proper technique and have a coach correct improper technique.  Wear a tennis elbow (counterforce) brace. PROGNOSIS  The course of this condition depends on the degree of the injury. If treated properly, acute cases (symptoms lasting less than 4 weeks) are often resolved in 2 to 6 weeks. Chronic (longer lasting cases) often resolve in 3 to 6 months but may require physical therapy. RELATED COMPLICATIONS   Frequently recurring symptoms, resulting in a chronic problem. Properly treating the problem the first time decreases frequency of recurrence.  Chronic inflammation, scarring tendon degeneration, and partial tendon tear, requiring surgery.  Delayed healing or resolution of symptoms. TREATMENT  Treatment first involves the use of ice and medicine to reduce pain and inflammation. Strengthening and stretching exercises may help reduce discomfort if performed regularly. These exercises may be performed at home if the condition is an acute injury. Chronic cases may require a referral to a physical therapist for evaluation and treatment. Your caregiver may advise a corticosteroid injection to help reduce inflammation. Rarely, surgery is needed. MEDICATION  If pain medicine is needed, nonsteroidal anti-inflammatory medicines (aspirin and ibuprofen), or other minor pain relievers (acetaminophen), are often advised.  Do not take pain medicine for 7 days before surgery.  Prescription pain relievers may be given, if your caregiver thinks they are needed. Use only as directed and only as much as you need.  Corticosteroid injections may be recommended. These injections should be reserved only for the most severe cases, because they can only be given a certain number of times. HEAT AND COLD  Cold treatment (icing) should  be applied for 10 to 15 minutes every 2 to 3 hours for inflammation and pain, and  immediately after activity that aggravates your symptoms. Use ice packs or an ice massage.  Heat treatment may be used before performing stretching and strengthening activities prescribed by your caregiver, physical therapist, or athletic trainer. Use a heat pack or a warm water soak. SEEK MEDICAL CARE IF: Symptoms get worse or do not improve in 2 weeks, despite treatment. EXERCISES  RANGE OF MOTION (ROM) AND STRETCHING EXERCISES - Epicondylitis, Lateral (Tennis Elbow) These exercises may help you when beginning to rehabilitate your injury. Your symptoms may go away with or without further involvement from your physician, physical therapist, or athletic trainer. While completing these exercises, remember:   Restoring tissue flexibility helps normal motion to return to the joints. This allows healthier, less painful movement and activity.  An effective stretch should be held for at least 30 seconds.  A stretch should never be painful. You should only feel a gentle lengthening or release in the stretched tissue. RANGE OF MOTION - Wrist Flexion, Active-Assisted  Extend your right / left elbow with your fingers pointing down.*  Gently pull the back of your hand towards you, until you feel a gentle stretch on the top of your forearm.  Hold this position for __________ seconds. Repeat __________ times. Complete this exercise __________ times per day.  *If directed by your physician, physical therapist or athletic trainer, complete this stretch with your elbow bent, rather than extended. RANGE OF MOTION - Wrist Extension, Active-Assisted  Extend your right / left elbow and turn your palm upwards.*  Gently pull your palm and fingertips back, so your wrist extends and your fingers point more toward the ground.  You should feel a gentle stretch on the inside of your forearm.  Hold this position for __________ seconds. Repeat __________ times. Complete this exercise __________ times per  day. *If directed by your physician, physical therapist or athletic trainer, complete this stretch with your elbow bent, rather than extended. STRETCH - Wrist Flexion  Place the back of your right / left hand on a tabletop, leaving your elbow slightly bent. Your fingers should point away from your body.  Gently press the back of your hand down onto the table by straightening your elbow. You should feel a stretch on the top of your forearm.  Hold this position for __________ seconds. Repeat __________ times. Complete this stretch __________ times per day.  STRETCH - Wrist Extension   Place your right / left fingertips on a tabletop, leaving your elbow slightly bent. Your fingers should point backwards.  Gently press your fingers and palm down onto the table by straightening your elbow. You should feel a stretch on the inside of your forearm.  Hold this position for __________ seconds. Repeat __________ times. Complete this stretch __________ times per day.  STRENGTHENING EXERCISES - Epicondylitis, Lateral (Tennis Elbow) These exercises may help you when beginning to rehabilitate your injury. They may resolve your symptoms with or without further involvement from your physician, physical therapist, or athletic trainer. While completing these exercises, remember:   Muscles can gain both the endurance and the strength needed for everyday activities through controlled exercises.  Complete these exercises as instructed by your physician, physical therapist or athletic trainer. Increase the resistance and repetitions only as guided.  You may experience muscle soreness or fatigue, but the pain or discomfort you are trying to eliminate should never worsen during these exercises. If this pain  does get worse, stop and make sure you are following the directions exactly. If the pain is still present after adjustments, discontinue the exercise until you can discuss the trouble with your  caregiver. STRENGTH - Wrist Flexors  Sit with your right / left forearm palm-up and fully supported on a table or countertop. Your elbow should be resting below the height of your shoulder. Allow your wrist to extend over the edge of the surface.  Loosely holding a __________ weight, or a piece of rubber exercise band or tubing, slowly curl your hand up toward your forearm.  Hold this position for __________ seconds. Slowly lower the wrist back to the starting position in a controlled manner. Repeat __________ times. Complete this exercise __________ times per day.  STRENGTH - Wrist Extensors  Sit with your right / left forearm palm-down and fully supported on a table or countertop. Your elbow should be resting below the height of your shoulder. Allow your wrist to extend over the edge of the surface.  Loosely holding a __________ weight, or a piece of rubber exercise band or tubing, slowly curl your hand up toward your forearm.  Hold this position for __________ seconds. Slowly lower the wrist back to the starting position in a controlled manner. Repeat __________ times. Complete this exercise __________ times per day.  STRENGTH - Ulnar Deviators  Stand with a ____________________ weight in your right / left hand, or sit while holding a rubber exercise band or tubing, with your healthy arm supported on a table or countertop.  Move your wrist, so that your pinkie travels toward your forearm and your thumb moves away from your forearm.  Hold this position for __________ seconds and then slowly lower the wrist back to the starting position. Repeat __________ times. Complete this exercise __________ times per day STRENGTH - Radial Deviators  Stand with a ____________________ weight in your right / left hand, or sit while holding a rubber exercise band or tubing, with your injured arm supported on a table or countertop.  Raise your hand upward in front of you or pull up on the rubber  tubing.  Hold this position for __________ seconds and then slowly lower the wrist back to the starting position. Repeat __________ times. Complete this exercise __________ times per day. STRENGTH - Forearm Supinators   Sit with your right / left forearm supported on a table, keeping your elbow below shoulder height. Rest your hand over the edge, palm down.  Gently grip a hammer or a soup ladle.  Without moving your elbow, slowly turn your palm and hand upward to a "thumbs-up" position.  Hold this position for __________ seconds. Slowly return to the starting position. Repeat __________ times. Complete this exercise __________ times per day.  STRENGTH - Forearm Pronators   Sit with your right / left forearm supported on a table, keeping your elbow below shoulder height. Rest your hand over the edge, palm up.  Gently grip a hammer or a soup ladle.  Without moving your elbow, slowly turn your palm and hand upward to a "thumbs-up" position.  Hold this position for __________ seconds. Slowly return to the starting position. Repeat __________ times. Complete this exercise __________ times per day.  STRENGTH - Grip  Grasp a tennis ball, a dense sponge, or a large, rolled sock in your hand.  Squeeze as hard as you can, without increasing any pain.  Hold this position for __________ seconds. Release your grip slowly. Repeat __________ times. Complete this exercise  __________ times per day.  STRENGTH - Elbow Extensors, Isometric  Stand or sit upright, on a firm surface. Place your right / left arm so that your palm faces your stomach, and it is at the height of your waist.  Place your opposite hand on the underside of your forearm. Gently push up as your right / left arm resists. Push as hard as you can with both arms, without causing any pain or movement at your right / left elbow. Hold this stationary position for __________ seconds. Gradually release the tension in both arms. Allow  your muscles to relax completely before repeating.   This information is not intended to replace advice given to you by your health care provider. Make sure you discuss any questions you have with your health care provider.   Document Released: 06/19/2005 Document Revised: 07/10/2014 Document Reviewed: 10/01/2008 Elsevier Interactive Patient Education [employer].

## 2015-08-09 NOTE — Assessment & Plan Note (Signed)
Taking zocor 20 mg daily, checking lipid panel and adjust as needed.

## 2015-08-09 NOTE — Assessment & Plan Note (Signed)
Talked to him about the need for working on diet which is hard as he travels for his work a lot. He will also improve on routine exercise.

## 2015-08-09 NOTE — Assessment & Plan Note (Signed)
Checking uric acid, taking allopurinol 300 mg daily. No flare in the last year.

## 2015-08-24 ENCOUNTER — Other Ambulatory Visit (INDEPENDENT_AMBULATORY_CARE_PROVIDER_SITE_OTHER): Payer: Managed Care, Other (non HMO)

## 2015-08-24 DIAGNOSIS — Z0000 Encounter for general adult medical examination without abnormal findings: Secondary | ICD-10-CM

## 2015-08-24 DIAGNOSIS — R7989 Other specified abnormal findings of blood chemistry: Secondary | ICD-10-CM | POA: Diagnosis not present

## 2015-08-24 LAB — COMPREHENSIVE METABOLIC PANEL
ALBUMIN: 4.2 g/dL (ref 3.5–5.2)
ALT: 25 U/L (ref 0–53)
AST: 20 U/L (ref 0–37)
Alkaline Phosphatase: 49 U/L (ref 39–117)
BUN: 19 mg/dL (ref 6–23)
CALCIUM: 9.8 mg/dL (ref 8.4–10.5)
CHLORIDE: 104 meq/L (ref 96–112)
CO2: 28 meq/L (ref 19–32)
CREATININE: 0.99 mg/dL (ref 0.40–1.50)
GFR: 83.34 mL/min (ref >60.00)
Glucose, Bld: 136 mg/dL — ABNORMAL HIGH (ref 70–99)
Potassium: 4.5 mEq/L (ref 3.5–5.1)
Sodium: 139 mEq/L (ref 135–145)
Total Bilirubin: 0.6 mg/dL (ref 0.2–1.2)
Total Protein: 7.1 g/dL (ref 6.0–8.3)

## 2015-08-24 LAB — LIPID PANEL
CHOL/HDL RATIO: 5
CHOLESTEROL: 234 mg/dL — AB (ref 0–200)
HDL: 44.6 mg/dL (ref >39.00)
NONHDL: 189.31
TRIGLYCERIDES: 303 mg/dL — AB (ref 0.0–149.0)
VLDL: 60.6 mg/dL — ABNORMAL HIGH (ref 0.0–40.0)

## 2015-08-24 LAB — URIC ACID: Uric Acid, Serum: 6.4 mg/dL (ref 4.0–7.8)

## 2015-08-24 LAB — LDL CHOLESTEROL, DIRECT: Direct LDL: 137 mg/dL

## 2015-08-25 LAB — HEPATITIS C ANTIBODY: HCV AB: NEGATIVE

## 2016-05-15 ENCOUNTER — Telehealth: Payer: Self-pay | Admitting: Internal Medicine

## 2016-05-15 NOTE — Telephone Encounter (Signed)
Patient states he is out of states he is [place] hunting in [place] and has hurt his back.  He believes he has a pinched nerve.  Patient states he spoke to the pharmacy there and they stated that Dr. Sharlet Salina could prescribe him a script for a patch of some sort.  Patient states the [facility] [place] miles away from where he is located.  Please follow up with patient in regard.

## 2016-05-15 NOTE — Telephone Encounter (Signed)
Left patient a message to call me back with the pharmacy he wants [place] to send the rx to down in [place].

## 2016-05-15 NOTE — Telephone Encounter (Signed)
Please find out pharmacy (put in meds and orders) and resend back to me.

## 2016-05-22 ENCOUNTER — Encounter: Payer: Self-pay | Admitting: Nurse Practitioner

## 2016-05-22 ENCOUNTER — Ambulatory Visit (INDEPENDENT_AMBULATORY_CARE_PROVIDER_SITE_OTHER): Payer: Managed Care, Other (non HMO) | Admitting: Nurse Practitioner

## 2016-05-22 VITALS — BP 118/80 | HR 72 | Temp 97.0°F | Ht 70.0 in | Wt 223.0 lb

## 2016-05-22 DIAGNOSIS — M5442 Lumbago with sciatica, left side: Secondary | ICD-10-CM | POA: Diagnosis not present

## 2016-05-22 MED ORDER — METHYLPREDNISOLONE ACETATE 80 MG/ML IJ SUSP
80.0000 mg | Freq: Once | INTRAMUSCULAR | Status: AC
  Administered 2016-05-22: 80 mg via INTRAMUSCULAR

## 2016-05-22 MED ORDER — CYCLOBENZAPRINE HCL 10 MG PO TABS: 10.0000 mg | ORAL_TABLET | Freq: Every day | ORAL | 0 refills | Status: DC

## 2016-05-22 MED ORDER — PREDNISONE 10 MG (21) PO TBPK: 10.0000 mg | ORAL_TABLET | Freq: Every day | ORAL | 0 refills | Status: DC

## 2016-05-22 NOTE — Patient Instructions (Signed)
Return to office if no improvement in 2weeks. Start oral prednisone tomorrow (take with food).  Back Pain, Adult Introduction Back pain is very common. The pain often gets better over time. The cause of back pain is usually not dangerous. Most people can learn to manage their back pain on their own. Follow these instructions at home: Watch your back pain for any changes. The following actions may help to lessen any pain you are feeling:  Stay active. Start with short walks on flat ground if you can. Try to walk farther each day.  Exercise regularly as told by your doctor. Exercise helps your back heal faster. It also helps avoid future injury by keeping your muscles strong and flexible.  Do not sit, drive, or stand in one place for more than 30 minutes.  Do not stay in bed. Resting more than 1-2 days can slow down your recovery.  Be careful when you bend or lift an object. Use good form when lifting:  Bend at your knees.  Keep the object close to your body.  Do not twist.  Sleep on a firm mattress. Lie on your side, and bend your knees. If you lie on your back, put a pillow under your knees.  Take medicines only as told by your doctor.  Put ice on the injured area.  Put ice in a plastic bag.  Place a towel between your skin and the bag.  Leave the ice on for 20 minutes, 2-3 times a day for the first 2-3 days. After that, you can switch between ice and heat packs.  Avoid feeling anxious or stressed. Find good ways to deal with stress, such as exercise.  Maintain a healthy weight. Extra weight puts stress on your back. Contact a doctor if:  You have pain that does not go away with rest or medicine.  You have worsening pain that goes down into your legs or buttocks.  You have pain that does not get better in one week.  You have pain at night.  You lose weight.  You have a fever or chills. Get help right away if:  You cannot control when you poop (bowel movement) or  pee (urinate).  Your arms or legs feel weak.  Your arms or legs lose feeling (numbness).  You feel sick to your stomach (nauseous) or throw up (vomit).  You have belly (abdominal) pain.  You feel like you may pass out (faint). This information is not intended to replace advice given to you by your health care provider. Make sure you discuss any questions you have with your health care provider. Document Released: 12/06/2007 Document Revised: 11/25/2015 Document Reviewed: 10/21/2013  2017 Elsevier   Back Exercises Introduction If you have pain in your back, do these exercises 2-3 times each day or as told by your doctor. When the pain goes away, do the exercises once each day, but repeat the steps more times for each exercise (do more repetitions). If you do not have pain in your back, do these exercises once each day or as told by your doctor. Exercises Single Knee to Chest  Do these steps 3-5 times in a row for each leg: 1. Lie on your back on a firm bed or the floor with your legs stretched out. 2. Bring one knee to your chest. 3. Hold your knee to your chest by grabbing your knee or thigh. 4. Pull on your knee until you feel a gentle stretch in your lower back. 5. Keep  doing the stretch for 10-30 seconds. 6. Slowly let go of your leg and straighten it. Pelvic Tilt  Do these steps 5-10 times in a row: 1. Lie on your back on a firm bed or the floor with your legs stretched out. 2. Bend your knees so they point up to the ceiling. Your feet should be flat on the floor. 3. Tighten your lower belly (abdomen) muscles to press your lower back against the floor. This will make your tailbone point up to the ceiling instead of pointing down to your feet or the floor. 4. Stay in this position for 5-10 seconds while you gently tighten your muscles and breathe evenly. Cat-Cow  Do these steps until your lower back bends more easily: 1. Get on your hands and knees on a firm surface. Keep your  hands under your shoulders, and keep your knees under your hips. You may put padding under your knees. 2. Let your head hang down, and make your tailbone point down to the floor so your lower back is round like the back of a cat. 3. Stay in this position for 5 seconds. 4. Slowly lift your head and make your tailbone point up to the ceiling so your back hangs low (sags) like the back of a cow. 5. Stay in this position for 5 seconds. Press-Ups  Do these steps 5-10 times in a row: 1. Lie on your belly (face-down) on the floor. 2. Place your hands near your head, about shoulder-width apart. 3. While you keep your back relaxed and keep your hips on the floor, slowly straighten your arms to raise the top half of your body and lift your shoulders. Do not use your back muscles. To make yourself more comfortable, you may change where you place your hands. 4. Stay in this position for 5 seconds. 5. Slowly return to lying flat on the floor. Bridges  Do these steps 10 times in a row: 1. Lie on your back on a firm surface. 2. Bend your knees so they point up to the ceiling. Your feet should be flat on the floor. 3. Tighten your butt muscles and lift your butt off of the floor until your waist is almost as high as your knees. If you do not feel the muscles working in your butt and the back of your thighs, slide your feet 1-2 inches farther away from your butt. 4. Stay in this position for 3-5 seconds. 5. Slowly lower your butt to the floor, and let your butt muscles relax. If this exercise is too easy, try doing it with your arms crossed over your chest. Belly Crunches  Do these steps 5-10 times in a row: 1. Lie on your back on a firm bed or the floor with your legs stretched out. 2. Bend your knees so they point up to the ceiling. Your feet should be flat on the floor. 3. Cross your arms over your chest. 4. Tip your chin a little bit toward your chest but do not bend your neck. 5. Tighten your belly  muscles and slowly raise your chest just enough to lift your shoulder blades a tiny bit off of the floor. 6. Slowly lower your chest and your head to the floor. Back Lifts  Do these steps 5-10 times in a row: 1. Lie on your belly (face-down) with your arms at your sides, and rest your forehead on the floor. 2. Tighten the muscles in your legs and your butt. 3. Slowly lift your chest  off of the floor while you keep your hips on the floor. Keep the back of your head in line with the curve in your back. Look at the floor while you do this. 4. Stay in this position for 3-5 seconds. 5. Slowly lower your chest and your face to the floor. Contact a doctor if:  Your back pain gets a lot worse when you do an exercise.  Your back pain does not lessen 2 hours after you exercise. If you have any of these problems, stop doing the exercises. Do not do them again unless your doctor says it is okay. Get help right away if:  You have sudden, very bad back pain. If this happens, stop doing the exercises. Do not do them again unless your doctor says it is okay. This information is not intended to replace advice given to you by your health care provider. Make sure you discuss any questions you have with your health care provider. Document Released: 07/22/2010 Document Revised: 11/25/2015 Document Reviewed: 08/13/2014  2017 Elsevier

## 2016-05-22 NOTE — Progress Notes (Signed)
Pre visit review using our clinic review tool, if applicable. No additional management support is needed unless otherwise documented below in the visit note. 

## 2016-05-22 NOTE — Progress Notes (Signed)
Subjective:  Patient ID: Jeffrey Dougherty, male    DOB: [birth year]  Age: 55 y.o. MRN: [id number]  CC: Back Pain   Back Pain  This is a new problem. The current episode started 1 to 4 weeks ago. The problem occurs constantly. The problem has been gradually worsening since onset. The pain is present in the lumbar spine. The quality of the pain is described as aching, burning and cramping. The pain radiates to the left knee and left thigh. The pain is moderate. The symptoms are aggravated by lying down and twisting. Stiffness is present all day. Pertinent negatives include no abdominal pain, bladder incontinence, bowel incontinence, chest pain, dysuria, fever, headaches, leg pain, numbness, paresis, paresthesias, pelvic pain, perianal numbness, tingling, weakness or weight loss. Risk factors include recent trauma (onset after helping friend lift lumbar). He has tried NSAIDs and analgesics (tramadol, medrol dose pack and aspercreme.) for the symptoms. The treatment provided mild (some improvement with medrol dose pack only) relief.    Outpatient Medications Prior to Visit  Medication Sig Dispense Refill  . allopurinol (ZYLOPRIM) 300 MG tablet Take 1 tablet (300 mg total) by mouth daily. 90 tablet 3  . ALPRAZolam (XANAX) 0.5 MG tablet Take 1 tablet (0.5 mg total) by mouth 2 (two) times daily as needed. 60 tablet 1  . hydrocortisone (ANUSOL-HC) 2.5 % rectal cream Place 1 application rectally 2 (two) times daily. 30 g 11  . simvastatin (ZOCOR) 20 MG tablet Take 1 tablet (20 mg total) by mouth every morning. 90 tablet 3   No facility-administered medications prior to visit.     ROS See HPI  Objective:  BP 118/80 (BP Location: Left Arm, Patient Position: Sitting, Cuff Size: Normal)   Pulse 72   Temp 97 F (36.1 C)   Ht 5\' 10"  (1.778 m)   Wt 223 lb (101.2 kg)   SpO2 96%   BMI 32.00 kg/m   BP Readings from Last 3 Encounters:  05/22/16 118/80  08/09/15 118/62  10/21/14 108/79    Wt  Readings from Last 3 Encounters:  05/22/16 223 lb (101.2 kg)  08/09/15 228 lb 12.8 oz (103.8 kg)  10/21/14 222 lb (100.7 kg)    Physical Exam  Constitutional: He is oriented to person, place, and time.  Musculoskeletal: He exhibits no edema.       Right hip: Normal.       Left hip: Normal.       Right knee: Normal.       Left knee: Normal.       Right ankle: Normal.       Left ankle: Normal.       Right upper leg: Normal.       Left upper leg: Normal.       Right lower leg: Normal.       Left lower leg: Normal.       Legs: Negative straight leg raise. No CVA tenderness  Neurological: He is alert and oriented to person, place, and time.    Lab Results  Component Value Date   WBC 9.2 12/27/2013   HGB 15.4 12/27/2013   HCT 43.6 12/27/2013   PLT 183 12/27/2013   GLUCOSE 136 (H) 08/24/2015   CHOL 234 (H) 08/24/2015   TRIG 303.0 (H) 08/24/2015   HDL 44.60 08/24/2015   LDLDIRECT 137.0 08/24/2015   ALT 25 08/24/2015   AST 20 08/24/2015   NA 139 08/24/2015   K 4.5 08/24/2015   CL 104 08/24/2015  CREATININE 0.99 08/24/2015   BUN 19 08/24/2015   CO2 28 08/24/2015   TSH 3.03 02/08/2009   PSA 0.55 11/21/2010   HGBA1C 6.0 08/13/2007    No results found.  Assessment & Plan:   Aidrik was seen today for back pain.  Diagnoses and all orders for this visit:  Acute left-sided low back pain with left-sided sciatica -     methylPREDNISolone acetate (DEPO-MEDROL) injection 80 mg; Inject 1 mL (80 mg total) into the muscle once. -     predniSONE (STERAPRED UNI-PAK 21 TAB) 10 MG (21) TBPK tablet; Take 1 tablet (10 mg total) by mouth daily. -     cyclobenzaprine (FLEXERIL) 10 MG tablet; Take 1 tablet (10 mg total) by mouth at bedtime.   I have discontinued Mr. Otting's methylPREDNISolone and traMADol. I am also having him start on predniSONE and cyclobenzaprine. Additionally, I am having him maintain his ALPRAZolam, hydrocortisone, simvastatin, and allopurinol. We administered  methylPREDNISolone acetate.  Meds ordered this encounter  Medications  . DISCONTD: methylPREDNISolone (MEDROL) 4 MG tablet    Sig: Take 4 mg by mouth daily.  Marland Kitchen DISCONTD: traMADol (ULTRAM) 50 MG tablet    Sig: Take by mouth every 6 (six) hours as needed.  . methylPREDNISolone acetate (DEPO-MEDROL) injection 80 mg  . predniSONE (STERAPRED UNI-PAK 21 TAB) 10 MG (21) TBPK tablet    Sig: Take 1 tablet (10 mg total) by mouth daily.    Dispense:  21 tablet    Refill:  0    Order Specific Question:   Supervising Provider    Answer:   Cassandria Anger [[id number]]  . cyclobenzaprine (FLEXERIL) 10 MG tablet    Sig: Take 1 tablet (10 mg total) by mouth at bedtime.    Dispense:  20 tablet    Refill:  0    Order Specific Question:   Supervising Provider    Answer:   Cassandria Anger [[id number]]    Follow-up: Return if symptoms worsen or fail to improve.  Wilfred Lacy, NP

## 2016-05-29 ENCOUNTER — Telehealth: Payer: Self-pay | Admitting: Internal Medicine

## 2016-05-29 ENCOUNTER — Other Ambulatory Visit: Payer: Self-pay | Admitting: Nurse Practitioner

## 2016-05-29 ENCOUNTER — Ambulatory Visit (INDEPENDENT_AMBULATORY_CARE_PROVIDER_SITE_OTHER)
Admission: RE | Admit: 2016-05-29 | Discharge: 2016-05-29 | Disposition: A | Discharge status: Home / Self Care | Payer: Managed Care, Other (non HMO) | Source: Ambulatory Visit | Attending: Nurse Practitioner | Admitting: Nurse Practitioner

## 2016-05-29 DIAGNOSIS — M5442 Lumbago with sciatica, left side: Secondary | ICD-10-CM

## 2016-05-29 NOTE — Telephone Encounter (Signed)
Patient called in saying that his back still hurts. It feels a little better, but not a lot. He wanted to know if he needs to come in again. Or if some of other kind of medication can be called in. Or if a referral should be sent him for him to go somewhere else. Please follow up with patient. Thank you.   Patient had saw Baldo Ash on Nov 20th.

## 2016-05-29 NOTE — Telephone Encounter (Signed)
Please advise patient  To go basement for lumber spine x-ray today. Order entered. Thank you

## 2016-05-29 NOTE — Telephone Encounter (Signed)
Routing to [place], please advise, thanks 

## 2016-05-29 NOTE — Telephone Encounter (Signed)
Patient coming in today for xray---routing to [place], fyi

## 2016-05-30 ENCOUNTER — Other Ambulatory Visit: Payer: Self-pay | Admitting: Nurse Practitioner

## 2016-05-30 DIAGNOSIS — M5442 Lumbago with sciatica, left side: Secondary | ICD-10-CM

## 2016-05-30 DIAGNOSIS — N200 Calculus of kidney: Secondary | ICD-10-CM

## 2016-05-30 MED ORDER — DOXYCYCLINE HYCLATE 100 MG PO TABS: 100.0000 mg | ORAL_TABLET | Freq: Two times a day (BID) | ORAL | 0 refills | Status: AC

## 2016-05-30 MED ORDER — HYDROCODONE-ACETAMINOPHEN 5-325 MG PO TABS: 1.0000 | ORAL_TABLET | Freq: Four times a day (QID) | ORAL | 0 refills | Status: DC | PRN

## 2016-05-30 MED ORDER — TAMSULOSIN HCL 0.4 MG PO CAPS: 0.4000 mg | ORAL_CAPSULE | Freq: Every day | ORAL | 0 refills | Status: DC

## 2016-05-30 NOTE — Progress Notes (Signed)
Lumbar spine x-ray indicates left nephrolithiasis.

## 2016-05-31 ENCOUNTER — Telehealth: Payer: Self-pay

## 2016-05-31 ENCOUNTER — Encounter: Payer: Self-pay | Admitting: Nurse Practitioner

## 2016-05-31 NOTE — Telephone Encounter (Signed)
Per [place], I have advised patient that rx for hydrocodone is at front desk---patient states he would like to continue with prednisone (this time tapering dosage down daily as is more appropriate than what he was doing) to see if prednisone will control pain---he has reached out to his urologist, dr Raelyn Number (patient has had so many kidney stones in past) and they both feel patients current pain is not related to kidney stone (kidney stone xays were compared and stone has not moved)---patient wants to wait on ct scan or any other tx until he tries tapering prednisone first----he will call back to let us know after he completes 10 tabs prednisone he has remaining----routing to [place], fyi----I have placed rx for hydrocodone at front desk

## 2016-08-11 ENCOUNTER — Encounter: Payer: Managed Care, Other (non HMO) | Admitting: Internal Medicine

## 2016-08-16 ENCOUNTER — Other Ambulatory Visit (INDEPENDENT_AMBULATORY_CARE_PROVIDER_SITE_OTHER): Payer: Managed Care, Other (non HMO)

## 2016-08-16 ENCOUNTER — Ambulatory Visit (INDEPENDENT_AMBULATORY_CARE_PROVIDER_SITE_OTHER): Payer: Managed Care, Other (non HMO) | Admitting: Internal Medicine

## 2016-08-16 ENCOUNTER — Encounter: Payer: Self-pay | Admitting: Internal Medicine

## 2016-08-16 VITALS — BP 118/70 | HR 73 | Temp 98.1°F | Ht 70.0 in | Wt 222.0 lb

## 2016-08-16 DIAGNOSIS — Z0000 Encounter for general adult medical examination without abnormal findings: Secondary | ICD-10-CM | POA: Diagnosis not present

## 2016-08-16 DIAGNOSIS — Z720 Tobacco use: Secondary | ICD-10-CM | POA: Diagnosis not present

## 2016-08-16 DIAGNOSIS — L819 Disorder of pigmentation, unspecified: Secondary | ICD-10-CM

## 2016-08-16 DIAGNOSIS — E785 Hyperlipidemia, unspecified: Secondary | ICD-10-CM | POA: Diagnosis not present

## 2016-08-16 DIAGNOSIS — E669 Obesity, unspecified: Secondary | ICD-10-CM

## 2016-08-16 DIAGNOSIS — D229 Melanocytic nevi, unspecified: Secondary | ICD-10-CM

## 2016-08-16 LAB — LIPID PANEL
CHOL/HDL RATIO: 4
Cholesterol: 219 mg/dL — ABNORMAL HIGH (ref 0–200)
HDL: 51.1 mg/dL (ref >39.00)
LDL CALC: 131 mg/dL — AB (ref 0–99)
NONHDL: 167.67
TRIGLYCERIDES: 184 mg/dL — AB (ref 0.0–149.0)
VLDL: 36.8 mg/dL (ref 0.0–40.0)

## 2016-08-16 LAB — CBC
HEMATOCRIT: 47.1 % (ref 39.0–52.0)
HEMOGLOBIN: 16.4 g/dL (ref 13.0–17.0)
MCHC: 34.9 g/dL (ref 30.0–36.0)
MCV: 96 fl (ref 78.0–100.0)
Platelets: 209 10*3/uL (ref 150.0–400.0)
RBC: 4.9 Mil/uL (ref 4.22–5.81)
RDW: 13.5 % (ref 11.5–15.5)
WBC: 6.5 10*3/uL (ref 4.0–10.5)

## 2016-08-16 LAB — COMPREHENSIVE METABOLIC PANEL
ALT: 32 U/L (ref 0–53)
AST: 20 U/L (ref 0–37)
Albumin: 4.5 g/dL (ref 3.5–5.2)
Alkaline Phosphatase: 55 U/L (ref 39–117)
BUN: 20 mg/dL (ref 6–23)
CHLORIDE: 103 meq/L (ref 96–112)
CO2: 29 meq/L (ref 19–32)
Calcium: 9.9 mg/dL (ref 8.4–10.5)
Creatinine, Ser: 0.99 mg/dL (ref 0.40–1.50)
GFR: 83.05 mL/min (ref >60.00)
GLUCOSE: 128 mg/dL — AB (ref 70–99)
Potassium: 4.8 mEq/L (ref 3.5–5.1)
Sodium: 138 mEq/L (ref 135–145)
Total Bilirubin: 0.4 mg/dL (ref 0.2–1.2)
Total Protein: 7.6 g/dL (ref 6.0–8.3)

## 2016-08-16 NOTE — Assessment & Plan Note (Signed)
Talked to him about quitting and he is working on it. Down to 3 packs per week. Reminded about the risks and harms from cigarette smoke.

## 2016-08-16 NOTE — Progress Notes (Signed)
   Subjective:    Patient ID: Jeffrey Dougherty, male    DOB: August 07, 1960, 56 y.o.   MRN: [id number]  HPI The patient is a 56 YO man coming in for wellness. No new concerns.   PMH, [facility], social history reviewed and updated.   Review of Systems  Constitutional: Negative.   HENT: Negative.   Eyes: Negative.   Respiratory: Negative for cough, chest tightness and shortness of breath.   Cardiovascular: Negative for chest pain, palpitations and leg swelling.  Gastrointestinal: Negative for abdominal distention, abdominal pain, constipation, diarrhea, nausea and vomiting.  Musculoskeletal: Negative.   Skin: Negative.   Neurological: Negative.   Psychiatric/Behavioral: Negative.       Objective:   Physical Exam  Constitutional: He is oriented to person, place, and time. He appears well-developed and well-nourished.  Overweight  HENT:  Head: Normocephalic and atraumatic.  Eyes: EOM are normal.  Neck: Normal range of motion.  Cardiovascular: Normal rate and regular rhythm.   Pulmonary/Chest: Effort normal and breath sounds normal. No respiratory distress. He has no wheezes. He has no rales.  Abdominal: Soft. Bowel sounds are normal. He exhibits no distension. There is no tenderness. There is no rebound.  Musculoskeletal: He exhibits no edema.  Neurological: He is alert and oriented to person, place, and time. Coordination normal.  Skin: Skin is warm and dry.  Psychiatric: He has a normal mood and affect.   Vitals:   08/16/16 0826  BP: 118/70  Pulse: 73  Temp: 98.1 F (36.7 C)  TempSrc: Oral  SpO2: 98%  Weight: 222 lb (100.7 kg)  Height: 5\' 10"  (1.778 m)      Assessment & Plan:

## 2016-08-16 NOTE — Patient Instructions (Addendum)
We will check the labs today.  Stop taking the simvastatin for 2-4 weeks to see if this helps with the weakness in the legs.   Think about stopping smoking altogether.   Health Maintenance, Male A healthy lifestyle and preventative care can promote health and wellness.  Maintain regular health, dental, and eye exams.  Eat a healthy diet. Foods like vegetables, fruits, whole grains, low-fat dairy products, and lean protein foods contain the nutrients you need and are low in calories. Decrease your intake of foods high in solid fats, added sugars, and salt. Get information about a proper diet from your health care provider, if necessary.  Regular physical exercise is one of the most important things you can do for your health. Most adults should get at least 150 minutes of moderate-intensity exercise (any activity that increases your heart rate and causes you to sweat) each week. In addition, most adults need muscle-strengthening exercises on 2 or more days a week.   Maintain a healthy weight. The body mass index (BMI) is a screening tool to identify possible weight problems. It provides an estimate of body fat based on height and weight. Your health care provider can find your BMI and can help you achieve or maintain a healthy weight. For males 20 years and older:  A BMI below 18.5 is considered underweight.  A BMI of 18.5 to 24.9 is normal.  A BMI of 25 to 29.9 is considered overweight.  A BMI of 30 and above is considered obese.  Maintain normal blood lipids and cholesterol by exercising and minimizing your intake of saturated fat. Eat a balanced diet with plenty of fruits and vegetables. Blood tests for lipids and cholesterol should begin at age 12 and be repeated every 5 years. If your lipid or cholesterol levels are high, you are over age 79, or you are at high risk for heart disease, you may need your cholesterol levels checked more frequently.Ongoing high lipid and cholesterol levels  should be treated with medicines if diet and exercise are not working.  If you smoke, find out from your health care provider how to quit. If you do not use tobacco, do not start.  Lung cancer screening is recommended for adults aged 58-80 years who are at high risk for developing lung cancer because of a history of smoking. A yearly low-dose CT scan of the lungs is recommended for people who have at least a 30-pack-year history of smoking and are current smokers or have quit within the past 15 years. A pack year of smoking is smoking an average of 1 pack of cigarettes a day for 1 year (for example, a 30-pack-year history of smoking could mean smoking 1 pack a day for 30 years or 2 packs a day for 15 years). Yearly screening should continue until the smoker has stopped smoking for at least 15 years. Yearly screening should be stopped for people who develop a health problem that would prevent them from having lung cancer treatment.  If you choose to drink alcohol, do not have more than 2 drinks per day. One drink is considered to be 12 oz (360 mL) of beer, 5 oz (150 mL) of wine, or 1.5 oz (45 mL) of liquor.  Avoid the use of street drugs. Do not share needles with anyone. Ask for help if you need support or instructions about stopping the use of drugs.  High blood pressure causes heart disease and increases the risk of stroke. High blood pressure is more  likely to develop in:  People who have blood pressure in the end of the normal range (100-139/85-89 mm Hg).  People who are overweight or obese.  People who are African American.  If you are 11-13 years of age, have your blood pressure checked every 3-5 years. If you are 67 years of age or older, have your blood pressure checked every year. You should have your blood pressure measured twice-once when you are at a hospital or clinic, and once when you are not at a hospital or clinic. Record the average of the two measurements. To check your blood  pressure when you are not at a hospital or clinic, you can use:  An automated blood pressure machine at a pharmacy.  A home blood pressure monitor.  If you are 2-35 years old, ask your health care provider if you should take aspirin to prevent heart disease.  Diabetes screening involves taking a blood sample to check your fasting blood sugar level. This should be done once every 3 years after age 52 if you are at a normal weight and without risk factors for diabetes. Testing should be considered at a younger age or be carried out more frequently if you are overweight and have at least 1 risk factor for diabetes.  Colorectal cancer can be detected and often prevented. Most routine colorectal cancer screening begins at the age of 60 and continues through age 7. However, your health care provider may recommend screening at an earlier age if you have risk factors for colon cancer. On a yearly basis, your health care provider may provide home test kits to check for hidden blood in the stool. A small camera at the end of a tube may be used to directly examine the colon (sigmoidoscopy or colonoscopy) to detect the earliest forms of colorectal cancer. Talk to your health care provider about this at age 18 when routine screening begins. A direct exam of the colon should be repeated every 5-10 years through age 63, unless early forms of precancerous polyps or small growths are found.  People who are at an increased risk for hepatitis B should be screened for this virus. You are considered at high risk for hepatitis B if:  You were born in a country where hepatitis B occurs often. Talk with your health care provider about which countries are considered high risk.  Your parents were born in a high-risk country and you have not received a shot to protect against hepatitis B (hepatitis B vaccine).  You have HIV or AIDS.  You use needles to inject street drugs.  You live with, or have sex with, someone who  has hepatitis B.  You are a man who has sex with other men (MSM).  You get hemodialysis treatment.  You take certain medicines for conditions like cancer, organ transplantation, and autoimmune conditions.  Hepatitis C blood testing is recommended for all people born from [birth year] through 1966 and any individual with known risk factors for hepatitis C.  Healthy men should no longer receive prostate-specific antigen (PSA) blood tests as part of routine cancer screening. Talk to your health care provider about prostate cancer screening.  Testicular cancer screening is not recommended for adolescents or adult males who have no symptoms. Screening includes self-exam, a health care provider exam, and other screening tests. Consult with your health care provider about any symptoms you have or any concerns you have about testicular cancer.  Practice safe sex. Use condoms and avoid high-risk sexual practices  to reduce the spread of sexually transmitted infections (STIs).  You should be screened for STIs, including gonorrhea and chlamydia if:  You are sexually active and are younger than 24 years.  You are older than 24 years, and your health care provider tells you that you are at risk for this type of infection.  Your sexual activity has changed since you were last screened, and you are at an increased risk for chlamydia or gonorrhea. Ask your health care provider if you are at risk.  If you are at risk of being infected with HIV, it is recommended that you take a prescription medicine daily to prevent HIV infection. This is called pre-exposure prophylaxis (PrEP). You are considered at risk if:  You are a man who has sex with other men (MSM).  You are a heterosexual man who is sexually active with multiple partners.  You take drugs by injection.  You are sexually active with a partner who has HIV.  Talk with your health care provider about whether you are at high risk of being infected with  HIV. If you choose to begin PrEP, you should first be tested for HIV. You should then be tested every 3 months for as long as you are taking PrEP.  Use sunscreen. Apply sunscreen liberally and repeatedly throughout the day. You should seek shade when your shadow is shorter than you. Protect yourself by wearing long sleeves, pants, a wide-brimmed hat, and sunglasses year round whenever you are outdoors.  Tell your health care provider of new moles or changes in moles, especially if there is a change in shape or color. Also, tell your health care provider if a mole is larger than the size of a pencil eraser.  A one-time screening for abdominal aortic aneurysm (AAA) and surgical repair of large AAAs by ultrasound is recommended for men aged 26-75 years who are current or former smokers.  Stay current with your vaccines (immunizations). This information is not intended to replace advice given to you by your health care provider. Make sure you discuss any questions you have with your health care provider. Document Released: 12/16/2007 Document Revised: 07/10/2014 Document Reviewed: 03/23/2015 Elsevier Interactive Patient Education  2017 [employer].

## 2016-08-16 NOTE — Assessment & Plan Note (Signed)
Tdap and flu shot up to date. Colonoscopy due in 2019 and he will get then. Counseled about exercise and weight. Counseled about sun safety and mole surveillance and dangers of distracted driving. Given screening recommendations.

## 2016-08-16 NOTE — Assessment & Plan Note (Signed)
Weight down from last year, he is working on goal weight 190 pounds.

## 2016-08-16 NOTE — Progress Notes (Signed)
Pre visit review using our clinic review tool, if applicable. No additional management support is needed unless otherwise documented below in the visit note. 

## 2016-08-16 NOTE — Assessment & Plan Note (Signed)
Having some new muscle weakness which could be related to simvastatin. Previously problems with lipitor. Will stop simvastatin 2-4 weeks and if resolved will change statin. Checking lipid panel today.

## 2016-09-11 ENCOUNTER — Encounter: Payer: Self-pay | Admitting: Internal Medicine

## 2016-09-12 NOTE — Telephone Encounter (Signed)
Can you help with his dermatology referral?

## 2016-10-12 ENCOUNTER — Other Ambulatory Visit: Payer: Self-pay | Admitting: Internal Medicine

## 2017-05-28 ENCOUNTER — Encounter: Payer: Self-pay | Admitting: Internal Medicine

## 2017-07-23 DIAGNOSIS — Z125 Encounter for screening for malignant neoplasm of prostate: Secondary | ICD-10-CM | POA: Diagnosis not present

## 2017-07-23 DIAGNOSIS — R3915 Urgency of urination: Secondary | ICD-10-CM | POA: Diagnosis not present

## 2017-07-23 LAB — PSA: PSA: 0.53

## 2017-08-13 DIAGNOSIS — N3281 Overactive bladder: Secondary | ICD-10-CM | POA: Diagnosis not present

## 2017-08-20 ENCOUNTER — Encounter: Payer: Self-pay | Admitting: Internal Medicine

## 2017-08-20 ENCOUNTER — Other Ambulatory Visit (INDEPENDENT_AMBULATORY_CARE_PROVIDER_SITE_OTHER): Payer: 59

## 2017-08-20 ENCOUNTER — Ambulatory Visit (INDEPENDENT_AMBULATORY_CARE_PROVIDER_SITE_OTHER): Payer: 59 | Admitting: Internal Medicine

## 2017-08-20 VITALS — BP 100/64 | HR 68 | Temp 98.7°F | Ht 70.0 in | Wt 213.0 lb

## 2017-08-20 DIAGNOSIS — F411 Generalized anxiety disorder: Secondary | ICD-10-CM | POA: Diagnosis not present

## 2017-08-20 DIAGNOSIS — Z6830 Body mass index (BMI) 30.0-30.9, adult: Secondary | ICD-10-CM | POA: Diagnosis not present

## 2017-08-20 DIAGNOSIS — Z720 Tobacco use: Secondary | ICD-10-CM | POA: Diagnosis not present

## 2017-08-20 DIAGNOSIS — Z0000 Encounter for general adult medical examination without abnormal findings: Secondary | ICD-10-CM | POA: Diagnosis not present

## 2017-08-20 DIAGNOSIS — K635 Polyp of colon: Secondary | ICD-10-CM

## 2017-08-20 DIAGNOSIS — E669 Obesity, unspecified: Secondary | ICD-10-CM

## 2017-08-20 DIAGNOSIS — E785 Hyperlipidemia, unspecified: Secondary | ICD-10-CM

## 2017-08-20 LAB — CBC
HCT: 47.4 % (ref 39.0–52.0)
HEMOGLOBIN: 16.3 g/dL (ref 13.0–17.0)
MCHC: 34.5 g/dL (ref 30.0–36.0)
MCV: 97.1 fl (ref 78.0–100.0)
PLATELETS: 209 10*3/uL (ref 150.0–400.0)
RBC: 4.88 Mil/uL (ref 4.22–5.81)
RDW: 13.2 % (ref 11.5–15.5)
WBC: 7.3 10*3/uL (ref 4.0–10.5)

## 2017-08-20 LAB — COMPREHENSIVE METABOLIC PANEL
ALT: 22 U/L (ref 0–53)
AST: 18 U/L (ref 0–37)
Albumin: 4.3 g/dL (ref 3.5–5.2)
Alkaline Phosphatase: 51 U/L (ref 39–117)
BILIRUBIN TOTAL: 0.5 mg/dL (ref 0.2–1.2)
BUN: 13 mg/dL (ref 6–23)
CALCIUM: 9.6 mg/dL (ref 8.4–10.5)
CO2: 29 meq/L (ref 19–32)
CREATININE: 1 mg/dL (ref 0.40–1.50)
Chloride: 103 mEq/L (ref 96–112)
GFR: 81.79 mL/min (ref >60.00)
GLUCOSE: 142 mg/dL — AB (ref 70–99)
Potassium: 4.8 mEq/L (ref 3.5–5.1)
SODIUM: 140 meq/L (ref 135–145)
Total Protein: 7.2 g/dL (ref 6.0–8.3)

## 2017-08-20 LAB — HEMOGLOBIN A1C: Hgb A1c MFr Bld: 6.8 % — ABNORMAL HIGH (ref 4.6–6.5)

## 2017-08-20 LAB — LIPID PANEL
CHOL/HDL RATIO: 4
CHOLESTEROL: 207 mg/dL — AB (ref 0–200)
HDL: 46.7 mg/dL (ref >39.00)
LDL CALC: 122 mg/dL — AB (ref 0–99)
NONHDL: 160.3
Triglycerides: 190 mg/dL — ABNORMAL HIGH (ref 0.0–149.0)
VLDL: 38 mg/dL (ref 0.0–40.0)

## 2017-08-20 MED ORDER — ALPRAZOLAM 0.5 MG PO TABS: 0.5000 mg | ORAL_TABLET | Freq: Two times a day (BID) | ORAL | 0 refills | Status: DC | PRN

## 2017-08-20 MED ORDER — SIMVASTATIN 20 MG PO TABS: 20.0000 mg | ORAL_TABLET | ORAL | 3 refills | Status: DC

## 2017-08-20 NOTE — Progress Notes (Signed)
   Subjective:    Patient ID: Jeffrey Dougherty, male    DOB: 1961-01-02, 57 y.o.   MRN: [id number]  HPI The patient is a 57 YO man coming in for physical.   PMH, [facility], social history reviewed and updated.   Review of Systems  Constitutional: Negative.   HENT: Negative.   Eyes: Negative.   Respiratory: Negative for cough, chest tightness and shortness of breath.   Cardiovascular: Negative for chest pain, palpitations and leg swelling.  Gastrointestinal: Negative for abdominal distention, abdominal pain, constipation, diarrhea, nausea and vomiting.  Musculoskeletal: Negative.   Skin: Negative.   Neurological: Negative.   Psychiatric/Behavioral: Negative.       Objective:   Physical Exam  Constitutional: He is oriented to person, place, and time. He appears well-developed and well-nourished.  HENT:  Head: Normocephalic and atraumatic.  Eyes: EOM are normal.  Neck: Normal range of motion.  Cardiovascular: Normal rate and regular rhythm.  Pulmonary/Chest: Effort normal and breath sounds normal. No respiratory distress. He has no wheezes. He has no rales.  Abdominal: Soft. Bowel sounds are normal. He exhibits no distension. There is no tenderness. There is no rebound.  Musculoskeletal: He exhibits no edema.  Neurological: He is alert and oriented to person, place, and time. Coordination normal.  Skin: Skin is warm and dry.  Psychiatric: He has a normal mood and affect.   Vitals:   08/20/17 0756  BP: 100/64  Pulse: 68  Temp: 98.7 F (37.1 C)  TempSrc: Oral  SpO2: 95%  Weight: 213 lb (96.6 kg)  Height: 5\' 10"  (1.778 m)      Assessment & Plan:

## 2017-08-20 NOTE — Assessment & Plan Note (Signed)
Mostly around traveling and refilled alprazolam. Refill #30 today and counseled about dangers of dependence with regular use and he will avoid regular usage.

## 2017-08-20 NOTE — Assessment & Plan Note (Signed)
Counseled about the need for regular exercise to help improve his health.

## 2017-08-20 NOTE — Assessment & Plan Note (Signed)
Time spent counseling about tobacco usage: 3 minutes. I have asked about smoking and is smoking less than usual. The patient is advised to quit. The patient is not willing to quit. They would like to try to quit in the next 12 months. We will follow up with them in 1 year.

## 2017-08-20 NOTE — Assessment & Plan Note (Signed)
Refer to GI for colonoscopy due in April. Flu declined. Tetanus up to date. Added to shingrix waiting list. Counseled about sun safety and mole surveillance. Given screening recommendations.

## 2017-08-20 NOTE — Assessment & Plan Note (Signed)
Taking simvastatin 20 mg daily and checking lipid panel and adjust as needed.  

## 2017-08-20 NOTE — Patient Instructions (Signed)

## 2017-08-24 ENCOUNTER — Other Ambulatory Visit: Payer: Self-pay | Admitting: Internal Medicine

## 2017-08-24 MED ORDER — SIMVASTATIN 40 MG PO TABS: 40.0000 mg | ORAL_TABLET | ORAL | 3 refills | Status: DC

## 2017-08-28 ENCOUNTER — Encounter: Payer: Self-pay | Admitting: Gastroenterology

## 2017-08-28 ENCOUNTER — Encounter: Payer: Self-pay | Admitting: Internal Medicine

## 2017-09-07 DIAGNOSIS — M545 Low back pain: Secondary | ICD-10-CM | POA: Diagnosis not present

## 2017-09-11 ENCOUNTER — Encounter: Payer: Self-pay | Admitting: Internal Medicine

## 2017-09-11 ENCOUNTER — Ambulatory Visit (INDEPENDENT_AMBULATORY_CARE_PROVIDER_SITE_OTHER): Payer: 59 | Admitting: Internal Medicine

## 2017-09-11 DIAGNOSIS — M545 Low back pain, unspecified: Secondary | ICD-10-CM

## 2017-09-11 MED ORDER — PREDNISONE 20 MG PO TABS: 20.0000 mg | ORAL_TABLET | Freq: Every day | ORAL | 0 refills | Status: DC

## 2017-09-11 MED ORDER — METHYLPREDNISOLONE ACETATE 40 MG/ML IJ SUSP
40.0000 mg | Freq: Once | INTRAMUSCULAR | Status: AC
  Administered 2017-09-11: 40 mg via INTRAMUSCULAR

## 2017-09-11 MED ORDER — KETOROLAC TROMETHAMINE 30 MG/ML IJ SOLN
30.0000 mg | Freq: Once | INTRAMUSCULAR | Status: AC
  Administered 2017-09-11: 30 mg via INTRAMUSCULAR

## 2017-09-11 NOTE — Assessment & Plan Note (Signed)
Some radiation into the stomach. Depo-medrol 40 mg IM and toradol 30 mg IM given at visit. Steroid burst given as well. Given stretching exercises.

## 2017-09-11 NOTE — Progress Notes (Signed)
   Subjective:    Patient ID: Jeffrey Dougherty, male    DOB: Jan 31, 1961, 57 y.o.   MRN: [id number]  HPI The patient is a 57 YO man coming in for acute low back pain. Going on for 1 week now. He was seen at the urgent care and they gave him flexeril and meloxicam which he has been taking without relief. It is worsening overall since onset. Denies injury. Some prior back problems in the last several years. He denies trigger known. Some lifting with new litter of puppies which may have contributed. Denies fevers or chills. No weight change. No pain into his legs or instability of his legs.   Review of Systems  Constitutional: Positive for activity change. Negative for appetite change, fatigue, fever and unexpected weight change.  Respiratory: Negative.   Cardiovascular: Negative.   Musculoskeletal: Positive for back pain and myalgias. Negative for arthralgias.  Skin: Negative.   Neurological: Negative for syncope, weakness and numbness.      Objective:   Physical Exam  Constitutional: He is oriented to person, place, and time. He appears well-developed and well-nourished. He appears distressed.  Uncomfortable during visit  HENT:  Head: Normocephalic and atraumatic.  Eyes: EOM are normal.  Neck: Normal range of motion.  Cardiovascular: Normal rate and regular rhythm.  Pulmonary/Chest: Effort normal and breath sounds normal. No respiratory distress. He has no wheezes. He has no rales.  Abdominal: Soft. He exhibits no distension and no mass. There is tenderness. There is no rebound and no guarding.  Some radiation from the back, no tenderness to touch itself.   Musculoskeletal: He exhibits tenderness. He exhibits no edema.  Pain in the midline lumbar without radiation to touch, pain with twisting and bending.   Neurological: He is alert and oriented to person, place, and time. Coordination normal.  Skin: Skin is warm and dry.   Vitals:   09/11/17 0827  BP: 112/78  Pulse: 84  Temp: 97.8 F  (36.6 C)  TempSrc: Oral  SpO2: 94%  Weight: 222 lb (100.7 kg)  Height: 5\' 10"  (1.778 m)      Assessment & Plan:  Depo-medrol 40 mg IM and toradol 30 mg IM given at visit

## 2017-09-11 NOTE — Patient Instructions (Signed)
We have sent in prednisone to take 1 pill daily starting today for 5 days.  We have given you a shot today with steroids and pain medicine which should help.    Back Injury Prevention Back injuries can be very painful. They can also be difficult to heal. After having one back injury, you are more likely to injure your back again. It is important to learn how to avoid injuring or re-injuring your back. The following tips can help you to prevent a back injury. What should I know about physical fitness?  Exercise for 30 minutes per day on most days of the week or as told by your doctor. Make sure to: ? Do aerobic exercises, such as walking, jogging, biking, or swimming. ? Do exercises that increase balance and strength, such as tai chi and yoga. ? Do stretching exercises. This helps with flexibility. ? Try to develop strong belly (abdominal) muscles. Your belly muscles help to support your back.  Stay at a healthy weight. This helps to decrease your risk of a back injury. What should I know about my diet?  Talk with your doctor about your overall diet. Take supplements and vitamins only as told by your doctor.  Talk with your doctor about how much calcium and vitamin D you need each day. These nutrients help to prevent weakening of the bones (osteoporosis).  Include good sources of calcium in your diet, such as: ? Dairy products. ? Green leafy vegetables. ? Products that have had calcium added to them (fortified).  Include good sources of vitamin D in your diet, such as: ? Milk. ? Foods that have had vitamin D added to them. What should I know about my posture?  Sit up straight and stand up straight. Avoid leaning forward when you sit or hunching over when you stand.  Choose chairs that have good low-back (lumbar) support.  If you work at a desk, sit close to it so you do not need to lean over. Keep your chin tucked in. Keep your neck drawn back. Keep your elbows bent so your arms  look like the letter "L" (right angle).  Sit high and close to the steering wheel when you drive. Add a low-back support to your car seat, if needed.  Avoid sitting or standing in one position for very long. Take breaks to get up, stretch, and walk around at least one time every hour. Take breaks every hour if you are driving for long periods of time.  Sleep on your side with your knees slightly bent, or sleep on your back with a pillow under your knees. Do not lie on the front of your body to sleep. What should I know about lifting, twisting, and reaching? Lifting and Heavy Lifting   Avoid heavy lifting, especially lifting over and over again. If you must do heavy lifting: ? Stretch before lifting. ? Work slowly. ? Rest between lifts. ? Use a tool such as a cart or a dolly to move objects if one is available. ? Make several small trips instead of carrying one heavy load. ? Ask for help when you need it, especially when moving big objects.  Follow these steps when lifting: ? Stand with your feet shoulder-width apart. ? Get as close to the object as you can. Do not pick up a heavy object that is far from your body. ? Use handles or lifting straps if they are available. ? Bend at your knees. Squat down, but keep your heels off  the floor. ? Keep your shoulders back. Keep your chin tucked in. Keep your back straight. ? Lift the object slowly while you tighten the muscles in your legs, belly, and butt. Keep the object as close to the center of your body as possible.  Follow these steps when putting down a heavy load: ? Stand with your feet shoulder-width apart. ? Lower the object slowly while you tighten the muscles in your legs, belly, and butt. Keep the object as close to the center of your body as possible. ? Keep your shoulders back. Keep your chin tucked in. Keep your back straight. ? Bend at your knees. Squat down, but keep your heels off the floor. ? Use handles or lifting straps if  they are available. Twisting and Reaching  Avoid lifting heavy objects above your waist.  Do not twist at your waist while you are lifting or carrying a load. If you need to turn, move your feet.  Do not bend over without bending at your knees.  Avoid reaching over your head, across a table, or for an object on a high surface. What are some other tips?  Avoid wet floors and icy ground. Keep sidewalks clear of ice to prevent falls.  Do not sleep on a mattress that is too soft or too hard.  Keep items that you use often within easy reach.  Put heavier objects on shelves at waist level, and put lighter objects on lower or higher shelves.  Find ways to lower your stress, such as: ? Exercise. ? Massage. ? Relaxation techniques.  Talk with your doctor if you feel anxious or depressed. These conditions can make back pain worse.  Wear flat heel shoes with cushioned soles.  Avoid making quick (sudden) movements.  Use both shoulder straps when carrying a backpack.  Do not use any tobacco products, including cigarettes, chewing tobacco, or electronic cigarettes. If you need help quitting, ask your doctor. This information is not intended to replace advice given to you by your health care provider. Make sure you discuss any questions you have with your health care provider. Document Released: 12/06/2007 Document Revised: 11/25/2015 Document Reviewed: 06/23/2014 Elsevier Interactive Patient Education  [employer].

## 2017-09-27 DIAGNOSIS — Z8709 Personal history of other diseases of the respiratory system: Secondary | ICD-10-CM | POA: Diagnosis not present

## 2017-10-02 DIAGNOSIS — K121 Other forms of stomatitis: Secondary | ICD-10-CM | POA: Diagnosis not present

## 2017-10-02 DIAGNOSIS — R5382 Chronic fatigue, unspecified: Secondary | ICD-10-CM | POA: Diagnosis not present

## 2017-10-02 DIAGNOSIS — Z0000 Encounter for general adult medical examination without abnormal findings: Secondary | ICD-10-CM | POA: Diagnosis not present

## 2017-10-03 ENCOUNTER — Encounter (INDEPENDENT_AMBULATORY_CARE_PROVIDER_SITE_OTHER): Payer: Self-pay | Admitting: Physical Medicine and Rehabilitation

## 2017-10-03 ENCOUNTER — Ambulatory Visit (INDEPENDENT_AMBULATORY_CARE_PROVIDER_SITE_OTHER): Payer: 59 | Admitting: Physical Medicine and Rehabilitation

## 2017-10-03 VITALS — BP 129/80 | HR 58 | Temp 97.9°F

## 2017-10-03 DIAGNOSIS — M5116 Intervertebral disc disorders with radiculopathy, lumbar region: Secondary | ICD-10-CM | POA: Diagnosis not present

## 2017-10-03 MED ORDER — MELOXICAM 15 MG PO TABS: 15.0000 mg | ORAL_TABLET | Freq: Every day | ORAL | 0 refills | Status: DC

## 2017-10-03 NOTE — Progress Notes (Signed)
 .  Numeric Pain Rating Scale and Functional Assessment Average Pain 5 Pain Right Now 10 My pain is intermittent, sharp and stabbing Pain is worse with: standing Pain improves with: rest and medication   In the last MONTH (on 0-10 scale) has pain interfered with the following?  1. General activity like being  able to carry out your everyday physical activities such as walking, climbing stairs, carrying groceries, or moving a chair?  Rating(8)  2. Relation with others like being able to carry out your usual social activities and roles such as  activities at home, at work and in your community. Rating(7)  3. Enjoyment of life such that you have  been bothered by emotional problems such as feeling anxious, depressed or irritable?  Rating(6)

## 2017-10-03 NOTE — Patient Instructions (Signed)

## 2017-10-03 NOTE — Progress Notes (Signed)
Jeffrey Dougherty - 57 y.o. male MRN [id number]  Date of birth: [birth year]  Office Visit Note: Visit Date: 10/03/2017 PCP: Jeffrey Koch, MD Referred by: Jeffrey Dougherty, *  Subjective: Chief Complaint  Patient presents with  . Lower Back - Pain  . Left Hip - Pain   HPI: Jeffrey Dougherty is a 57 year old gentleman that I last saw in 2013 and we completed epidural injection at the time with good relief of his generalized low back pain and some hip and buttock pain at the time.  He had an MRI from 2008 showing broad disc bulging with some caudal downturning and degenerative change.  He had follow-up with Jeffrey Dougherty for quite some time for his orthopedic care.  He comes in today with a 4-week history of low back pain which was very severe and even referring into the left buttock and groin area.  He reports that it is worsened when he sits for any period of time especially if he tries to stand up.  If he stands up and extends his lumbar spine will actually get some relief.  He reports taking an excessively long time trying to get into a good position once he stands.  He denies any pain down the leg or paresthesia.  He reports a situation where he initially went to an urgent care facility and the person there told him it was not sciatica because it was no tingling numbness.  He was Dougherty 7.5 mg of meloxicam as well as cyclobenzaprine which really did not help much.  He ended up seeing his regular primary care doctor who started him on a prednisone taper and this helped greatly along with time.  He has had some relief of symptoms over the last 4 weeks.  He has bouts of this off and on.  He reports prior to this he had driven to [place] for a hunting trip with a hot Quail.  He is in the car for a very long time during that trip.  He also sits a lot at work but he does have a situation where he is been able to work into a stand up desk type of situation.  He has had no focal weakness or bowel bladder  difficulties.  He has had physical therapy in the past but not recently.  He is actually seen a chiropractor in the past as well.   Review of Systems  Constitutional: Negative for chills, fever, malaise/fatigue and weight loss.  HENT: Negative for hearing loss and sinus pain.   Eyes: Negative for blurred vision, double vision and photophobia.  Respiratory: Negative for cough and shortness of breath.   Cardiovascular: Negative for chest pain, palpitations and leg swelling.  Gastrointestinal: Negative for abdominal pain, nausea and vomiting.  Genitourinary: Negative for flank pain.  Musculoskeletal: Positive for back pain. Negative for myalgias.  Skin: Negative for itching and rash.  Neurological: Negative for tremors, focal weakness and weakness.  Endo/Heme/Allergies: Negative.   Psychiatric/Behavioral: Negative for depression.  All other systems reviewed and are negative.  Otherwise per HPI.  Assessment & Plan: Visit Diagnoses:  1. Radiculopathy due to lumbar intervertebral disc disorder     Plan: Findings:  4 weeks of pretty severe low back pain radiating into the left buttock and somewhat of the anterior upper thigh.  This is worse with sitting and and going from sit to stand.  Patient has a history of L5-S1 degenerative disc broad bulging with down turning and likely has somewhat of  an annular tear or possibly disc protrusion at this point.  He is gotten a lot better with prednisone and at this point really only has symptoms when he goes from sit to stand and tries to adjust or if he sits for a prolonged period we talked about activity modification as well as lifting and sitting.  We discussed core strengthening and I did print out some exercises for him.  We talked about medications.  I am going to refill the meloxicam for 15 mg to be taken for the next month.  He does not have any other red flag symptoms or other issues that would make me concerned about the meloxicam at least for another  week or 2.  If his symptoms stagnate or they begin to get worse and we could look at epidural injection versus MRI of the lumbar spine.  He really does not need an MRI at this point Dougherty the amount of relief she has had with prednisone and activity.  At this point we will see him back as needed.    Meds & Orders:  Meds ordered this encounter  Medications  . meloxicam (MOBIC) 15 MG tablet    Sig: Take 1 tablet (15 mg total) by mouth daily. Take with food    Dispense:  30 tablet    Refill:  0   No orders of the defined types were placed in this encounter.   Follow-up: Return if symptoms worsen or fail to improve.   Procedures: No procedures performed  No notes on file   Clinical History: MRI LUMBAR SPINE WITHOUT CONTRAST  Technique: Multiplanar and multiecho pulse sequences of the lumbar spine, to include the lower thoracic and upper sacral regions, were obtained according to standard protocol without IV contrast.   Comparison: None.    Findings: Study is interpreted accounting for five nonribbearing lumbar vertebrae. The last open disk level is denoted L5-S1.    Alignment of the spine is anatomic without listhesis. There is disk desiccation and height loss at L2 with anterior osteophytic spurring. Mild disk desiccation with osteophytic spurring anteriorly at L2-3. At L5-S1, there is disk desiccation with broad central protrusion and slight caudal downturning. On the parasagittal sequences, there is no foraminal encroachment. The paravertebral structures are unremarkable. The conus terminates at the T12-L1 level. Axial imaging:   L1-2: Mild facet degenerative change.    L2-3: Disk bulge right without central nor foraminal stenosis.    L3-4: Normal.    L4-5: Mild facet degenerative change.   L5-S1: Broad central disk protrusion with caudal downturning. No mass effect. L5 roots exit without encroachment.    IMPRESSION: Mild degenerative changes as described above.   He reports  that he has been smoking cigarettes.  He has been smoking about 0.25 packs per day. He has quit using smokeless tobacco.  Recent Labs    08/20/17 0820  HGBA1C 6.8*    Objective:  VS:  HT:    WT:   BMI:     BP:129/80  HR:(!) 58bpm  TEMP:97.9 F (36.6 C)(Oral)  RESP:96 % Physical Exam  Constitutional: He is oriented to person, place, and time. He appears well-developed and well-nourished. No distress.  HENT:  Head: Normocephalic and atraumatic.  Nose: Nose normal.  Mouth/Throat: Oropharynx is clear and moist.  Eyes: Pupils are equal, round, and reactive to light. Conjunctivae are normal.  Neck: Normal range of motion. Neck supple. No tracheal deviation present.  Cardiovascular: Regular rhythm and intact distal pulses.  Pulmonary/Chest: Effort  normal and breath sounds normal.  Abdominal: Soft. He exhibits no distension. There is no rebound and no guarding.  Musculoskeletal: He exhibits no deformity.  Patient ambulates without aid with a fairly normal gait.  Going from sit to stand he has difficulty in the upper part of the range of motion and then he has significant pain with initial extension.  He has mild pain with extension after that.  He has no pain over the greater trochanters or PSIS.  He has an equivocally positive slump test on the left.  He has good distal strength.  Neurological: He is alert and oriented to person, place, and time. He exhibits normal muscle tone. Coordination normal.  Skin: Skin is warm. No rash noted.  Psychiatric: He has a normal mood and affect. His behavior is normal.  Nursing note and vitals reviewed.   Ortho Exam Imaging: No results found.  Past Medical/Family/Surgical/Social History: Medications & Allergies reviewed per EMR, new medications updated. Patient Active Problem List   Diagnosis Date Noted  . Acute low back pain 09/11/2017  . Tobacco abuse 08/09/2015  . Routine health maintenance 03/31/2012  . Gout 07/25/2010  . Obesity 07/25/2010   . Anxiety state 02/09/2009  . Hyperlipidemia 07/22/2007   Past Medical History:  Diagnosis Date  . Anal or rectal pain 01/2009  . Anxiety state, unspecified   . External otitis   . History of chickenpox   . Hyperlipidemia   . Nonspecific elevation of levels of transaminase or lactic acid dehydrogenase (LDH)   . Pneumonia   . Pneumonia, organism unspecified(486)   . Preglaucoma, unspecified   . Prostatitis   . Temporomandibular joint disorders, unspecified    Family History  Problem Relation Age of Onset  . Alcohol abuse Mother   . Hip fracture Mother   . Cancer Mother        breast  . Breast cancer Mother   . Cancer Father        Prostate cancer  . Arthritis Father        rheumatoid  . Colon polyps Father   . Prostate cancer Father   . Pancreatic cancer Brother   . Coronary artery disease Neg Hx   . Stroke Neg Hx    Past Surgical History:  Procedure Laterality Date  . APPENDECTOMY    . TONSILLECTOMY    . tracheotomy    . VASECTOMY     Social History   Occupational History  . Occupation: PARTS DIRECTOR    Employer: [employer]  Tobacco Use  . Smoking status: Current Some Day Smoker    Packs/day: 0.25    Types: Cigarettes  . Smokeless tobacco: Former Network engineer and Sexual Activity  . Alcohol use: Yes    Alcohol/week: 28.8 oz    Types: 48 Cans of beer per week  . Drug use: No  . Sexual activity: Yes    Partners: Female

## 2017-10-05 ENCOUNTER — Ambulatory Visit (AMBULATORY_SURGERY_CENTER): Payer: Self-pay | Admitting: *Deleted

## 2017-10-05 ENCOUNTER — Encounter: Payer: Self-pay | Admitting: Gastroenterology

## 2017-10-05 ENCOUNTER — Other Ambulatory Visit: Payer: Self-pay

## 2017-10-05 VITALS — Ht 69.5 in | Wt 221.2 lb

## 2017-10-05 DIAGNOSIS — Z86010 Personal history of colonic polyps: Secondary | ICD-10-CM

## 2017-10-05 MED ORDER — NA SULFATE-K SULFATE-MG SULF 17.5-3.13-1.6 GM/177ML PO SOLN: 1.0000 [IU] | Freq: Once | ORAL | 0 refills | Status: AC

## 2017-10-05 NOTE — Progress Notes (Signed)
No egg or soy allergy known to patient  No issues with past sedation with any surgeries  or procedures, no intubation problems  No diet pills per patient No home 02 use per patient  No blood thinners per patient  Pt denies issues with constipation  No A fib or A flutter  EMMI video sent to pt's e mail  

## 2017-10-17 ENCOUNTER — Encounter: Payer: Self-pay | Admitting: Gastroenterology

## 2017-10-17 ENCOUNTER — Other Ambulatory Visit: Payer: Self-pay

## 2017-10-17 ENCOUNTER — Ambulatory Visit (AMBULATORY_SURGERY_CENTER): Payer: 59 | Admitting: Gastroenterology

## 2017-10-17 VITALS — BP 113/80 | HR 59 | Temp 98.0°F | Resp 12 | Ht 69.0 in | Wt 221.0 lb

## 2017-10-17 DIAGNOSIS — Z86010 Personal history of colonic polyps: Secondary | ICD-10-CM | POA: Diagnosis present

## 2017-10-17 DIAGNOSIS — K5730 Diverticulosis of large intestine without perforation or abscess without bleeding: Secondary | ICD-10-CM

## 2017-10-17 MED ORDER — SODIUM CHLORIDE 0.9 % IV SOLN: 500.0000 mL | Freq: Once | INTRAVENOUS | Status: DC

## 2017-10-17 NOTE — Op Note (Signed)
[facility] Patient Name: Jeffrey Dougherty Procedure Date: 10/17/2017 9:57 AM MRN: [id number] Endoscopist: Milus Banister , MD Age: 57 Referring MD:  Date of Birth: 1961-04-28 Gender: Male Account #: [id number] Procedure:                Colonoscopy Indications:              High risk colon cancer surveillance: Personal                            history of colonic polyps; 2010 colonoscopy Dr.                            Sharlett Iles found 1 subCM adenoma. 2016 colonoscopy                            Dr. Ardis Hughs found 3 adenomas, one was 1.4cm Medicines:                Monitored Anesthesia Care Procedure:                Pre-Anesthesia Assessment:                           - Prior to the procedure, a History and Physical                            was performed, and patient medications and                            allergies were reviewed. The patient's tolerance of                            previous anesthesia was also reviewed. The risks                            and benefits of the procedure and the sedation                            options and risks were discussed with the patient.                            All questions were answered, and informed consent                            was obtained. Prior Anticoagulants: The patient has                            taken no previous anticoagulant or antiplatelet                            agents. ASA Grade Assessment: II - A patient with                            mild systemic disease. After reviewing the risks  and benefits, the patient was deemed in                            satisfactory condition to undergo the procedure.                           After obtaining informed consent, the colonoscope                            was passed under direct vision. Throughout the                            procedure, the patient's blood pressure, pulse, and                            oxygen saturations were  monitored continuously. The                            Colonoscope was introduced through the anus and                            advanced to the the cecum, identified by                            appendiceal orifice and ileocecal valve. The                            colonoscopy was performed without difficulty. The                            patient tolerated the procedure well. The quality                            of the bowel preparation was good. The ileocecal                            valve, appendiceal orifice, and rectum were                            photographed. Scope In: 10:12:29 AM Scope Out: 10:22:06 AM Scope Withdrawal Time: 0 hours 8 minutes 8 seconds  Total Procedure Duration: 0 hours 9 minutes 37 seconds  Findings:                 Multiple small and large-mouthed diverticula were                            found in the left colon.                           The exam was otherwise without abnormality on                            direct and retroflexion views. Complications:            No immediate complications.  Estimated blood loss:                            None. Estimated Blood Loss:     Estimated blood loss: none. Impression:               - Diverticulosis in the left colon.                           - The examination was otherwise normal on direct                            and retroflexion views.                           - No polyps or cancers. Recommendation:           - Patient has a contact number available for                            emergencies. The signs and symptoms of potential                            delayed complications were discussed with the                            patient. Return to normal activities tomorrow.                            Written discharge instructions were provided to the                            patient.                           - Resume previous diet.                           - Continue present medications.                            - Repeat colonoscopy in 5 years for surveillance. Milus Banister, MD 10/17/2017 10:26:07 AM This report has been signed electronically.

## 2017-10-17 NOTE — Progress Notes (Signed)
To recovery, report to RN, VSS. 

## 2017-10-17 NOTE — Patient Instructions (Signed)
YOU HAD AN ENDOSCOPIC PROCEDURE TODAY AT THE [facility]:   Refer to the procedure report that was given to you for any specific questions about what was found during the examination.  If the procedure report does not answer your questions, please call your gastroenterologist to clarify.  If you requested that your care partner not be given the details of your procedure findings, then the procedure report has been included in a sealed envelope for you to review at your convenience later.  YOU SHOULD EXPECT: Some feelings of bloating in the abdomen. Passage of more gas than usual.  Walking can help get rid of the air that was put into your GI tract during the procedure and reduce the bloating. If you had a lower endoscopy (such as a colonoscopy or flexible sigmoidoscopy) you may notice spotting of blood in your stool or on the toilet paper. If you underwent a bowel prep for your procedure, you may not have a normal bowel movement for a few days.  Please Note:  You might notice some irritation and congestion in your nose or some drainage.  This is from the oxygen used during your procedure.  There is no need for concern and it should clear up in a day or so.  SYMPTOMS TO REPORT IMMEDIATELY:   Following lower endoscopy (colonoscopy or flexible sigmoidoscopy):  Excessive amounts of blood in the stool  Significant tenderness or worsening of abdominal pains  Swelling of the abdomen that is new, acute  Fever of 100F or higher   For urgent or emergent issues, a gastroenterologist can be reached at any hour by calling [phone].   DIET:  We do recommend a small meal at first, but then you may proceed to your regular diet.  Drink plenty of fluids but you should avoid alcoholic beverages for 24 hours.  ACTIVITY:  You should plan to take it easy for the rest of today and you should NOT DRIVE or use heavy machinery until tomorrow (because of the sedation medicines used during the test).     FOLLOW UP: Our staff will call the number listed on your records the next business day following your procedure to check on you and address any questions or concerns that you may have regarding the information given to you following your procedure. If we do not reach you, we will leave a message.  However, if you are feeling well and you are not experiencing any problems, there is no need to return our call.  We will assume that you have returned to your regular daily activities without incident.  If any biopsies were taken you will be contacted by phone or by letter within the next 1-3 weeks.  Please call us at [phone] if you have not heard about the biopsies in 3 weeks.    SIGNATURES/CONFIDENTIALITY: You and/or your care partner have signed paperwork which will be entered into your electronic medical record.  These signatures attest to the fact that that the information above on your After Visit Summary has been reviewed and is understood.  Full responsibility of the confidentiality of this discharge information lies with you and/or your care-partner.  Read all handouts given to you by your recovery room nurse. 

## 2017-10-17 NOTE — Progress Notes (Signed)
Pt's states no medical or surgical changes since previsit or office visit. 

## 2017-10-18 ENCOUNTER — Telehealth: Payer: Self-pay | Admitting: *Deleted

## 2017-10-18 NOTE — Telephone Encounter (Signed)
  Follow up Call-  Call back number 10/17/2017  Post procedure Call Back phone  # [phone]  Permission to leave phone message Yes  Some recent data might be hidden     Patient questions:  Do you have a fever, pain , or abdominal swelling? No. Pain Score  0 *  Have you tolerated food without any problems? Yes.    Have you been able to return to your normal activities? Yes.    Do you have any questions about your discharge instructions: Diet   No. Medications  No. Follow up visit  No.  Do you have questions or concerns about your Care? No.  Actions: * If pain score is 4 or above: No action needed, pain <4.

## 2017-10-23 ENCOUNTER — Ambulatory Visit (INDEPENDENT_AMBULATORY_CARE_PROVIDER_SITE_OTHER): Payer: 59

## 2017-10-23 DIAGNOSIS — Z23 Encounter for immunization: Secondary | ICD-10-CM | POA: Diagnosis not present

## 2018-02-06 ENCOUNTER — Ambulatory Visit (INDEPENDENT_AMBULATORY_CARE_PROVIDER_SITE_OTHER): Payer: 59 | Admitting: *Deleted

## 2018-02-06 DIAGNOSIS — Z23 Encounter for immunization: Secondary | ICD-10-CM

## 2018-02-20 ENCOUNTER — Ambulatory Visit (INDEPENDENT_AMBULATORY_CARE_PROVIDER_SITE_OTHER): Payer: 59 | Admitting: Internal Medicine

## 2018-02-20 ENCOUNTER — Encounter: Payer: Self-pay | Admitting: Internal Medicine

## 2018-02-20 ENCOUNTER — Other Ambulatory Visit (INDEPENDENT_AMBULATORY_CARE_PROVIDER_SITE_OTHER): Payer: 59

## 2018-02-20 VITALS — BP 120/80 | HR 67 | Temp 98.8°F | Ht 69.0 in | Wt 223.0 lb

## 2018-02-20 DIAGNOSIS — E1169 Type 2 diabetes mellitus with other specified complication: Secondary | ICD-10-CM | POA: Diagnosis not present

## 2018-02-20 DIAGNOSIS — H9313 Tinnitus, bilateral: Secondary | ICD-10-CM | POA: Diagnosis not present

## 2018-02-20 DIAGNOSIS — E785 Hyperlipidemia, unspecified: Secondary | ICD-10-CM

## 2018-02-20 DIAGNOSIS — E119 Type 2 diabetes mellitus without complications: Secondary | ICD-10-CM

## 2018-02-20 LAB — LIPID PANEL
CHOL/HDL RATIO: 5
CHOLESTEROL: 228 mg/dL — AB (ref 0–200)
HDL: 49.1 mg/dL (ref >39.00)
NonHDL: 179.07
TRIGLYCERIDES: 257 mg/dL — AB (ref 0.0–149.0)
VLDL: 51.4 mg/dL — AB (ref 0.0–40.0)

## 2018-02-20 LAB — CK: Total CK: 145 U/L (ref 7–232)

## 2018-02-20 LAB — HEMOGLOBIN A1C: Hgb A1c MFr Bld: 7 % — ABNORMAL HIGH (ref 4.6–6.5)

## 2018-02-20 LAB — LDL CHOLESTEROL, DIRECT: Direct LDL: 145 mg/dL

## 2018-02-20 MED ORDER — ALPRAZOLAM 0.5 MG PO TABS: 0.5000 mg | ORAL_TABLET | Freq: Two times a day (BID) | ORAL | 2 refills | Status: DC | PRN

## 2018-02-20 NOTE — Assessment & Plan Note (Signed)
We had increased simvastatin to 40 mg daily, having some more muscle cramping so checking lipid panel and CK today. Adjust as needed.

## 2018-02-20 NOTE — Assessment & Plan Note (Signed)
Referral to ENT for evaluation of any new therapies for this.

## 2018-02-20 NOTE — Assessment & Plan Note (Addendum)
Most recent HgA1c 6.8, not on meds, on statin currently. Needs HgA1c today to confirm. Adjust as needed.

## 2018-02-20 NOTE — Patient Instructions (Signed)
We will check the sugars and the cholesterol today and the muscle numbers.

## 2018-02-20 NOTE — Progress Notes (Signed)
   Subjective:    Patient ID: Jeffrey Dougherty, male    DOB: 1961-01-27, 57 y.o.   MRN: [id number]  HPI The patient is a 57 YO man coming in for progression of his pre-diabetes to diabetes after our last visit. He has been working on changes to diet. Also needs follow up of his cholesterol (increased simvastatin to 40 mg daily at last visit, denies new side effects, having some more muscle cramps since increase in dosing, denies chest pains or numbness or weakness) as well as ongoing tinnitus (has had some many years, some old hearing damage from hunting and farm equipment, is careful about hearing protection now, denies ear pain or drainage, is bothered some by this at quiet times, can tune it out with other noises for distraction).   Review of Systems  Constitutional: Negative.   HENT: Negative.   Eyes: Negative.   Respiratory: Negative for cough, chest tightness and shortness of breath.   Cardiovascular: Negative for chest pain, palpitations and leg swelling.  Gastrointestinal: Negative for abdominal distention, abdominal pain, constipation, diarrhea, nausea and vomiting.  Musculoskeletal: Positive for myalgias.  Skin: Negative.   Neurological: Negative.   Psychiatric/Behavioral: Negative.       Objective:   Physical Exam  Constitutional: He is oriented to person, place, and time. He appears well-developed and well-nourished.  HENT:  Head: Normocephalic and atraumatic.  Eyes: EOM are normal.  Neck: Normal range of motion.  Cardiovascular: Normal rate and regular rhythm.  Pulmonary/Chest: Effort normal and breath sounds normal. No respiratory distress. He has no wheezes. He has no rales.  Abdominal: Soft. Bowel sounds are normal. He exhibits no distension. There is no tenderness. There is no rebound.  Musculoskeletal: He exhibits no edema.  Neurological: He is alert and oriented to person, place, and time. Coordination normal.  Skin: Skin is warm and dry.  Psychiatric: He has a normal  mood and affect.   Vitals:   02/20/18 0756  BP: 120/80  Pulse: 67  Temp: 98.8 F (37.1 C)  TempSrc: Oral  SpO2: 94%  Weight: 223 lb (101.2 kg)  Height: 5\' 9"  (1.753 m)      Assessment & Plan:

## 2018-02-25 ENCOUNTER — Encounter: Payer: Self-pay | Admitting: Internal Medicine

## 2018-03-25 DIAGNOSIS — H903 Sensorineural hearing loss, bilateral: Secondary | ICD-10-CM | POA: Diagnosis not present

## 2018-03-25 DIAGNOSIS — H9313 Tinnitus, bilateral: Secondary | ICD-10-CM | POA: Diagnosis not present

## 2018-03-25 DIAGNOSIS — J302 Other seasonal allergic rhinitis: Secondary | ICD-10-CM | POA: Diagnosis not present

## 2018-03-25 DIAGNOSIS — H833X3 Noise effects on inner ear, bilateral: Secondary | ICD-10-CM | POA: Diagnosis not present

## 2018-04-05 DIAGNOSIS — Z881 Allergy status to other antibiotic agents status: Secondary | ICD-10-CM | POA: Diagnosis not present

## 2018-04-05 DIAGNOSIS — R55 Syncope and collapse: Secondary | ICD-10-CM | POA: Diagnosis not present

## 2018-04-15 ENCOUNTER — Ambulatory Visit (INDEPENDENT_AMBULATORY_CARE_PROVIDER_SITE_OTHER): Payer: 59 | Admitting: Internal Medicine

## 2018-04-15 ENCOUNTER — Encounter: Payer: Self-pay | Admitting: Internal Medicine

## 2018-04-15 VITALS — BP 120/70 | HR 81 | Temp 98.0°F | Ht 69.0 in | Wt 227.0 lb

## 2018-04-15 DIAGNOSIS — E785 Hyperlipidemia, unspecified: Secondary | ICD-10-CM

## 2018-04-15 DIAGNOSIS — R05 Cough: Secondary | ICD-10-CM

## 2018-04-15 DIAGNOSIS — T7840XA Allergy, unspecified, initial encounter: Secondary | ICD-10-CM

## 2018-04-15 DIAGNOSIS — E1169 Type 2 diabetes mellitus with other specified complication: Secondary | ICD-10-CM

## 2018-04-15 DIAGNOSIS — R059 Cough, unspecified: Secondary | ICD-10-CM

## 2018-04-15 MED ORDER — ZOSTER VAC RECOMB ADJUVANTED 50 MCG/0.5ML IM SUSR: 0.5000 mL | Freq: Once | INTRAMUSCULAR | 1 refills | Status: DC

## 2018-04-15 MED ORDER — PITAVASTATIN CALCIUM 2 MG PO TABS: 2.0000 mg | ORAL_TABLET | Freq: Every day | ORAL | 3 refills | Status: DC

## 2018-04-15 MED ORDER — EPINEPHRINE 0.3 MG/0.3ML IJ SOAJ: 0.3000 mg | Freq: Once | INTRAMUSCULAR | 2 refills | Status: AC

## 2018-04-15 MED ORDER — PREDNISONE 20 MG PO TABS: 40.0000 mg | ORAL_TABLET | Freq: Every day | ORAL | 0 refills | Status: DC

## 2018-04-15 NOTE — Progress Notes (Signed)
   Subjective:    Patient ID: Jeffrey Dougherty, male    DOB: 1960-10-30, 57 y.o.   MRN: [id number]  HPI The patient is a 57 YO man coming in for several concerns including allergic reaction (while traveling he had exposure to cologne which he shook hands with someone wearing significant amount, then got tingling in that hand within 5 minutes, progressed to swelling in the hands and tingling, then had some feeling of swelling in his throat without ever SOB or problems breathing, he went to ER and got treatment ad steroids), and cough (going on for 4-5 days, get steroids at the ER for allergic reaction but no course, having some SOB with the cough, some allergy drainage, taking allergy medication otc currently, denies fevers or chills, denies muscle aches, denies known sick contacts but travels a lot, overall stable since onset) and cholesterol (on cholesterol medicine and this was causing him some side effects, previously on simvastatin and this elevated his CK level some and caused some muscle pains, not taking any currently, diabetes).   Review of Systems  Constitutional: Positive for activity change. Negative for appetite change, chills, fatigue, fever and unexpected weight change.  HENT: Positive for congestion, postnasal drip, rhinorrhea and sinus pressure. Negative for ear discharge, ear pain, sinus pain, sneezing, sore throat, tinnitus, trouble swallowing and voice change.   Eyes: Negative.   Respiratory: Positive for cough. Negative for chest tightness, shortness of breath and wheezing.   Cardiovascular: Negative.  Negative for chest pain, palpitations and leg swelling.  Gastrointestinal: Negative.  Negative for abdominal distention, abdominal pain, constipation, diarrhea, nausea and vomiting.  Musculoskeletal: Positive for joint swelling and myalgias.  Skin: Negative.   Allergic/Immunologic: Positive for environmental allergies. Negative for food allergies and immunocompromised state.    Neurological: Negative.   Psychiatric/Behavioral: Negative.       Objective:   Physical Exam  Constitutional: He is oriented to person, place, and time. He appears well-developed and well-nourished.  HENT:  Head: Normocephalic and atraumatic.  Oropharynx with redness and clear drainage, nose with swollen turbinates, TMs normal bilaterally  Eyes: EOM are normal.  Neck: Normal range of motion. No thyromegaly present.  Cardiovascular: Normal rate and regular rhythm.  Pulmonary/Chest: Effort normal and breath sounds normal. No respiratory distress. He has no wheezes. He has no rales.  Abdominal: Soft. Bowel sounds are normal. He exhibits no distension. There is no tenderness. There is no rebound.  Musculoskeletal: He exhibits no edema or tenderness.  Lymphadenopathy:    He has no cervical adenopathy.  Neurological: He is alert and oriented to person, place, and time. Coordination normal.  Skin: Skin is warm and dry.  Psychiatric: He has a normal mood and affect.   Vitals:   04/15/18 1453  BP: 120/70  Pulse: 81  Temp: 98 F (36.7 C)  TempSrc: Oral  SpO2: 96%  Weight: 227 lb (103 kg)  Height: 5\' 9"  (1.753 m)      Assessment & Plan:

## 2018-04-15 NOTE — Patient Instructions (Addendum)
We have sent in the epi-pen to use if needed.   We have sent in prednisone to clear up the congestion and cough take 2 pills daily for 4 days.   We have sent in the new cholesterol medicine to take 1 pill daily pitavastatin once you run out of simvastatin.

## 2018-04-18 DIAGNOSIS — R059 Cough, unspecified: Secondary | ICD-10-CM | POA: Insufficient documentation

## 2018-04-18 DIAGNOSIS — R05 Cough: Secondary | ICD-10-CM | POA: Insufficient documentation

## 2018-04-18 DIAGNOSIS — T7840XA Allergy, unspecified, initial encounter: Secondary | ICD-10-CM | POA: Insufficient documentation

## 2018-04-18 DIAGNOSIS — Z91018 Allergy to other foods: Secondary | ICD-10-CM | POA: Insufficient documentation

## 2018-04-18 NOTE — Assessment & Plan Note (Signed)
It is possible that the cologne caused the allergic reaction. Epi-pen rx provided and referral to allergy for skin testing.

## 2018-04-18 NOTE — Assessment & Plan Note (Signed)
Rx for pitavastatin for better cholesterol control and less sde effect. Had muscle aches and elevated CK with simvastatin.

## 2018-04-18 NOTE — Assessment & Plan Note (Signed)
Rx for prednisone for the cough as well as the allergic reaction given he did not take a steroid course for that.

## 2018-04-25 ENCOUNTER — Encounter: Payer: Self-pay | Admitting: Internal Medicine

## 2018-04-25 ENCOUNTER — Ambulatory Visit (INDEPENDENT_AMBULATORY_CARE_PROVIDER_SITE_OTHER): Payer: 59 | Admitting: Internal Medicine

## 2018-04-25 VITALS — BP 130/78 | HR 72 | Temp 98.1°F | Ht 69.0 in | Wt 225.0 lb

## 2018-04-25 DIAGNOSIS — R05 Cough: Secondary | ICD-10-CM

## 2018-04-25 DIAGNOSIS — R059 Cough, unspecified: Secondary | ICD-10-CM

## 2018-04-25 DIAGNOSIS — Z720 Tobacco use: Secondary | ICD-10-CM

## 2018-04-25 MED ORDER — FLUTICASONE PROPIONATE 50 MCG/ACT NA SUSP: 2.0000 | Freq: Every day | NASAL | 6 refills | Status: DC

## 2018-04-25 NOTE — Assessment & Plan Note (Signed)
Suspect post viral allergic reaction. Rx for flonase and advised to take this daily for 1-2 weeks. No indication for imaging, antibiotics or steroids today.

## 2018-04-25 NOTE — Progress Notes (Signed)
   Subjective:    Patient ID: Jeffrey Dougherty, male    DOB: September 23, 1960, 57 y.o.   MRN: [id number]  HPI The patient is a 57 YO man coming in for persistent cough and allergy symptoms. He just finished a course of prednisone for allergic reaction. This did stop his drainage while he was taking it. He denies fevers or chills in the last week. He is using alka seltzer cold and sinus for the last week and this is helping him to sleep and with some daytime symptoms. Not taking anything for allergies. He is having mild sinus pressure. Denies ear pain or pressure. Does have some nose drainage and hoarseness. Denies coughing up sputum. Denies SOB. Overall stable since onset and mildly improved today.   Review of Systems  Constitutional: Negative for activity change, appetite change, chills, fatigue, fever and unexpected weight change.  HENT: Positive for congestion, postnasal drip, rhinorrhea, sinus pressure and voice change. Negative for ear discharge, ear pain, sinus pain, sneezing, sore throat, tinnitus and trouble swallowing.   Eyes: Negative.   Respiratory: Positive for cough. Negative for chest tightness, shortness of breath and wheezing.   Cardiovascular: Negative.   Gastrointestinal: Negative.   Musculoskeletal: Negative.   Neurological: Negative.       Objective:   Physical Exam  Constitutional: He is oriented to person, place, and time. He appears well-developed and well-nourished.  HENT:  Head: Normocephalic and atraumatic.  Oropharynx with redness and clear drainage, nose with normal turbinates, TMs normal bilaterally  Eyes: EOM are normal.  Neck: Normal range of motion. No thyromegaly present.  Cardiovascular: Normal rate and regular rhythm.  Pulmonary/Chest: Effort normal and breath sounds normal. No respiratory distress. He has no wheezes. He has no rales.  Abdominal: Soft.  Lymphadenopathy:    He has no cervical adenopathy.  Neurological: He is alert and oriented to person, place,  and time.  Skin: Skin is warm and dry.   Vitals:   04/25/18 0818  BP: 130/78  Pulse: 72  Temp: 98.1 F (36.7 C)  TempSrc: Oral  SpO2: 97%  Weight: 225 lb (102.1 kg)  Height: 5\' 9"  (1.753 m)      Assessment & Plan:

## 2018-04-25 NOTE — Patient Instructions (Signed)
We have sent in flonase to use 2 sprays in each nostril once a day for the next 1-2 weeks. ?

## 2018-04-25 NOTE — Assessment & Plan Note (Signed)
Patient is advised that his smoking is likely causing his symptoms to stay longer than usual and causing him to have problems clearing symptoms and drainage. He is not willing or able to quit at this time.

## 2018-06-05 ENCOUNTER — Ambulatory Visit: Payer: 59 | Admitting: Allergy

## 2018-07-02 ENCOUNTER — Ambulatory Visit (INDEPENDENT_AMBULATORY_CARE_PROVIDER_SITE_OTHER): Payer: 59 | Admitting: Allergy and Immunology

## 2018-07-02 ENCOUNTER — Encounter: Payer: Self-pay | Admitting: Allergy and Immunology

## 2018-07-02 VITALS — BP 124/70 | HR 75 | Temp 97.9°F | Resp 17 | Ht 69.29 in | Wt 223.5 lb

## 2018-07-02 DIAGNOSIS — T7840XD Allergy, unspecified, subsequent encounter: Secondary | ICD-10-CM

## 2018-07-02 DIAGNOSIS — L500 Allergic urticaria: Secondary | ICD-10-CM | POA: Diagnosis not present

## 2018-07-02 MED ORDER — LEVOCETIRIZINE DIHYDROCHLORIDE 5 MG PO TABS: 5.0000 mg | ORAL_TABLET | Freq: Every evening | ORAL | 5 refills | Status: DC

## 2018-07-02 NOTE — Assessment & Plan Note (Addendum)
The patients history suggests allergic reaction with an unclear trigger. Food allergen skin tests were negative today despite a positive histamine control. The negative predictive value for skin tests is excellent (greater than 95%). We will proceed with in vitro lab studies to help establish an etiology.  The following labs have been ordered: Baseline serum tryptase, CBC, CMP, ESR, ANA, and serum specific IgE against galactose-alpha-1,3-galactose panel.   A prescription has been provided for levocetirizine, 5 mg daily as needed.  Should symptoms recur, a journal is to be kept recording any foods eaten, beverages consumed, medications taken within a 6 hour period prior to the onset of symptoms, as well as activities performed, and environmental conditions. For any symptoms concerning for anaphylaxis, epinephrine is to be administered and 911 is to be called immediately.

## 2018-07-02 NOTE — Progress Notes (Signed)
New Patient Note  RE: Jeffrey Dougherty MRN: [id number] DOB: Oct 27, 1960 Date of Office Visit: 07/02/2018  Referring provider: Hoyt Koch, * Primary care provider: Hoyt Koch, MD  Chief Complaint: Allergic Reaction   History of present illness: Brennin Dougherty is a 57 y.o. male seen today in consultation requested by Pricilla Holm, MD.  He reports that he had an allergic reaction approximately 2 months ago while on an out-of-town for business.  He states that approximately 6 PM his right palm began to itch and then "all of a sudden" he began to develop generalized urticaria, had diarrhea, experienced dyspnea, as well as a red rash in his groin area.  He was evaluated and treated in the local emergency department and recalls that the nurse said that his blood pressure was very low and wall establishing IV access mentioned the term "anaphylactic shock."  The last meal that he had consumed prior to the onset of symptoms was a hamburger for lunch with blue cheese and bacon.  [place] and has a history of multiple tick bites.  He also notes that approximately 1 hour prior to the onset of symptoms man he was "saturated in some kind of cologne" shook his right hand.  He reports that he has felt nauseated in the past when around strong cologne.  The ER physician mention the possibility of the reaction having been related to stress/anxiety.  And [place] does have a history of anxiety which he treats as needed with lorazepam.  However, he reports that on the day of the reaction he did not feel anxiety or stress about anything.  He was not bitten or stung by any insect that he is aware of.  He currently has access to an epinephrine autoinjector.  Assessment and plan: Allergic reaction The patients history suggests allergic reaction with an unclear trigger. Food allergen skin tests were negative today despite a positive histamine control. The  negative predictive value for skin tests is excellent (greater than 95%). We will proceed with in vitro lab studies to help establish an etiology.  The following labs have been ordered: Baseline serum tryptase, CBC, CMP, ESR, ANA, and serum specific IgE against galactose-alpha-1,3-galactose panel.   A prescription has been provided for levocetirizine, 5 mg daily as needed.  Should symptoms recur, a journal is to be kept recording any foods eaten, beverages consumed, medications taken within a 6 hour period prior to the onset of symptoms, as well as activities performed, and environmental conditions. For any symptoms concerning for anaphylaxis, epinephrine is to be administered and 911 is to be called immediately.   Meds ordered this encounter  Medications  . levocetirizine (XYZAL) 5 MG tablet    Sig: Take 1 tablet (5 mg total) by mouth every evening.    Dispense:  30 tablet    Refill:  5    Diagnostics: Environmental skin testing: Negative despite a positive histamine control. Food allergen skin testing: Negative despite a positive histamine control.    Physical examination: Blood pressure 124/70, pulse 75, temperature 97.9 F (36.6 C), temperature source Oral, resp. rate 17, height 5' 9.29" (1.76 m), weight 223 lb 8 oz (101.4 kg), SpO2 96 %.  General: Alert, interactive, in no acute distress. HEENT: TMs pearly gray, turbinates mildly edematous without discharge, post-pharynx unremarkable. Neck: Supple without lymphadenopathy. Lungs: Clear to auscultation without wheezing, rhonchi or rales. CV: Normal S1, S2 without murmurs. Abdomen: Nondistended, nontender. Skin: Warm and  dry, without lesions or rashes. Extremities:  No clubbing, cyanosis or edema. Neuro:   Grossly intact.  Review of systems:  Review of systems negative except as noted in HPI / PMHx or noted below: Review of Systems  Constitutional: Negative.   HENT: Negative.   Eyes: Negative.   Respiratory: Negative.     Cardiovascular: Negative.   Gastrointestinal: Negative.   Genitourinary: Negative.   Musculoskeletal: Negative.   Skin: Negative.   Neurological: Negative.   Endo/Heme/Allergies: Negative.   Psychiatric/Behavioral: Negative.     Past medical history:  Past Medical History:  Diagnosis Date  . Anal or rectal pain 01/2009  . Anxiety state, unspecified   . Arthritis   . Chronic kidney disease    kidney stones  . External otitis   . History of chickenpox   . Hyperlipidemia   . Nonspecific elevation of levels of transaminase or lactic acid dehydrogenase (LDH)   . Pneumonia   . Pneumonia, organism unspecified(486)   . Preglaucoma, unspecified   . Prostatitis   . Temporomandibular joint disorders, unspecified     Past surgical history:  Past Surgical History:  Procedure Laterality Date  . APPENDECTOMY    . COLONOSCOPY    . POLYPECTOMY    . TONSILLECTOMY    . tracheotomy    . VASECTOMY      Family history: Family History  Problem Relation Age of Onset  . Alcohol abuse Mother   . Hip fracture Mother   . Cancer Mother        breast  . Breast cancer Mother   . Allergic rhinitis Mother   . Cancer Father        Prostate cancer  . Arthritis Father        rheumatoid  . Colon polyps Father   . Prostate cancer Father   . Pancreatic cancer Brother   . Colon polyps Brother   . Coronary artery disease Neg Hx   . Stroke Neg Hx   . Colon cancer Neg Hx   . Esophageal cancer Neg Hx   . Stomach cancer Neg Hx   . Rectal cancer Neg Hx     Social history: Social History   Socioeconomic History  . Marital status: Married    Spouse name: Not on file  . Number of children: Not on file  . Years of education: 48  . Highest education level: Not on file  Occupational History  . Occupation: PARTS DIRECTOR    Employer: [employer]  Social Needs  . Financial resource strain: Not on file  . Food insecurity:    Worry: Not on file    Inability: Not on file  .  Transportation needs:    Medical: Not on file    Non-medical: Not on file  Tobacco Use  . Smoking status: Current Some Day Smoker    Packs/day: 0.50    Types: Cigarettes  . Smokeless tobacco: Never Used  Substance and Sexual Activity  . Alcohol use: Yes    Alcohol/week: 48.0 standard drinks    Types: 48 Cans of beer per week  . Drug use: No  . Sexual activity: Yes    Partners: Female  Lifestyle  . Physical activity:    Days per week: Not on file    Minutes per session: Not on file  . Stress: Not on file  Relationships  . Social connections:    Talks on phone: Not on file    Gets together: Not on file  Attends religious service: Not on file    Active member of club or organization: Not on file    Attends meetings of clubs or organizations: Not on file    Relationship status: Not on file  . Intimate partner violence:    Fear of current or ex partner: Not on file    Emotionally abused: Not on file    Physically abused: Not on file    Forced sexual activity: Not on file  Other Topics Concern  . Not on file  Social History Narrative   HSG. Married-'87.  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 . Marriage in good health.         Environmental History: The patient lives in a 58 year old house with carpeting throughout and central air/heat.  There is no known mold/water damage in the home.  There is a dog and a cat in the home, the dog has access to his bedroom.  He is a cigarette smoker, smoking half pack per day on average.  He does not recall when he started smoking.  Allergies as of 07/02/2018      Reactions   Ciprofloxacin    anxiety      Medication List       Accurate as of July 02, 2018  1:27 PM. Always use your most recent med list.        ALPRAZolam 0.5 MG tablet Commonly known as:  XANAX Take 1 tablet (0.5 mg total) by mouth 2 (two) times daily as needed.   EPINEPHrine 0.3  mg/0.3 mL Soaj injection Commonly known as:  EPI-PEN   levocetirizine 5 MG tablet Commonly known as:  XYZAL Take 1 tablet (5 mg total) by mouth every evening.   simvastatin 40 MG tablet Commonly known as:  ZOCOR       Known medication allergies: Allergies  Allergen Reactions  . Ciprofloxacin     anxiety    I appreciate the opportunity to take part in Giovoni's care. Please do not hesitate to contact me with questions.  Sincerely,   R. Edgar Frisk, MD

## 2018-07-02 NOTE — Patient Instructions (Addendum)
Allergic reaction The patients history suggests allergic reaction with an unclear trigger. Food allergen skin tests were negative today despite a positive histamine control. The negative predictive value for skin tests is excellent (greater than 95%). We will proceed with in vitro lab studies to help establish an etiology.  The following labs have been ordered: Baseline serum tryptase, CBC, CMP, ESR, ANA, and serum specific IgE against galactose-alpha-1,3-galactose panel.   A prescription has been provided for levocetirizine, 5 mg daily as needed.  Should symptoms recur, a journal is to be kept recording any foods eaten, beverages consumed, medications taken within a 6 hour period prior to the onset of symptoms, as well as activities performed, and environmental conditions. For any symptoms concerning for anaphylaxis, epinephrine is to be administered and 911 is to be called immediately.   When lab results have returned the patient will be called with further recommendations and follow up instructions.

## 2018-07-08 LAB — CBC WITH DIFFERENTIAL/PLATELET
Basophils Absolute: 0.1 10*3/uL (ref 0.0–0.2)
Basos: 1 %
EOS (ABSOLUTE): 0.2 10*3/uL (ref 0.0–0.4)
Eos: 2 %
Hematocrit: 42.2 % (ref 37.5–51.0)
Hemoglobin: 14.9 g/dL (ref 13.0–17.7)
Immature Grans (Abs): 0 10*3/uL (ref 0.0–0.1)
Immature Granulocytes: 0 %
Lymphocytes Absolute: 2.4 10*3/uL (ref 0.7–3.1)
Lymphs: 31 %
MCH: 32.7 pg (ref 26.6–33.0)
MCHC: 35.3 g/dL (ref 31.5–35.7)
MCV: 93 fL (ref 79–97)
Monocytes Absolute: 0.7 10*3/uL (ref 0.1–0.9)
Monocytes: 9 %
Neutrophils Absolute: 4.4 10*3/uL (ref 1.4–7.0)
Neutrophils: 57 %
Platelets: 197 10*3/uL (ref 150–450)
RBC: 4.56 x10E6/uL (ref 4.14–5.80)
RDW: 12.7 % (ref 12.3–15.4)
WBC: 7.7 10*3/uL (ref 3.4–10.8)

## 2018-07-08 LAB — COMPREHENSIVE METABOLIC PANEL
ALT: 29 IU/L (ref 0–44)
AST: 26 IU/L (ref 0–40)
Albumin/Globulin Ratio: 2.3 — ABNORMAL HIGH (ref 1.2–2.2)
Albumin: 4.4 g/dL (ref 3.5–5.5)
Alkaline Phosphatase: 60 IU/L (ref 39–117)
BUN/Creatinine Ratio: 19 (ref 9–20)
BUN: 14 mg/dL (ref 6–24)
Bilirubin Total: <0.2 mg/dL (ref 0.0–1.2)
CO2: 22 mmol/L (ref 20–29)
Calcium: 9.6 mg/dL (ref 8.7–10.2)
Chloride: 100 mmol/L (ref 96–106)
Creatinine, Ser: 0.72 mg/dL — ABNORMAL LOW (ref 0.76–1.27)
GFR calc Af Amer: 119 mL/min/{1.73_m2} (ref >59)
GFR calc non Af Amer: 103 mL/min/{1.73_m2} (ref >59)
Globulin, Total: 1.9 g/dL (ref 1.5–4.5)
Glucose: 150 mg/dL — ABNORMAL HIGH (ref 65–99)
Potassium: 4.4 mmol/L (ref 3.5–5.2)
Sodium: 137 mmol/L (ref 134–144)
Total Protein: 6.3 g/dL (ref 6.0–8.5)

## 2018-07-08 LAB — ALPHA-GAL PANEL
Alpha Gal IgE*: 57.4 kU/L — ABNORMAL HIGH (ref <0.10)
Beef (Bos spp) IgE: 9.99 kU/L — ABNORMAL HIGH (ref <0.35)
Class Interpretation: 2
Class Interpretation: 3
Class Interpretation: 3
Lamb/Mutton (Ovis spp) IgE: 0.85 kU/L — ABNORMAL HIGH (ref <0.35)
Pork (Sus spp) IgE: 4.23 kU/L — ABNORMAL HIGH (ref <0.35)

## 2018-07-08 LAB — ANA W/REFLEX IF POSITIVE: Anti Nuclear Antibody(ANA): NEGATIVE

## 2018-07-08 LAB — TRYPTASE: Tryptase: 3.7 ug/L (ref 2.2–13.2)

## 2018-07-08 LAB — SEDIMENTATION RATE: Sed Rate: 19 mm/hr (ref 0–30)

## 2018-08-30 ENCOUNTER — Telehealth: Payer: Self-pay | Admitting: Internal Medicine

## 2018-08-30 NOTE — Telephone Encounter (Signed)
Spoke with Darnelle Maffucci, pharmacist at [facility] who states that the pt was requesting a refill of Simvastatin. Notified Darnelle Maffucci that medication was discontinued due to side effects and pt is not longer taking medication. Darnelle Maffucci states that the last medication listed was pitavastatin which had not been filled since 04/15/18. Pitavastatin is not on pt's current med list. Notified Darnelle Maffucci that pt would need an office visit for refill request. Darnelle Maffucci states that he will deny the refill and have the pt contact the office.

## 2018-08-30 NOTE — Telephone Encounter (Signed)
Copied from [place] [id number]. Topic: Quick Communication - See Telephone Encounter >> Aug 30, 2018  4:55 PM Rutherford Nail, NT wrote: CRM for notification. See Telephone encounter for: 08/30/18. Pharmacy calling and states that the patient is wanting a refill on simvastatin (ZOCOR) 40 MG tablet, but does not think the patient is on this any longer. Please advise. Would like to know if patient is to continue taking the simvastatin.

## 2019-03-27 LAB — LIPID PANEL
Cholesterol: 270 — AB (ref 0–200)
HDL: 46 (ref 35–70)
LDL Cholesterol: 170
Triglycerides: 316 — AB (ref 40–160)

## 2019-03-27 LAB — BASIC METABOLIC PANEL: Glucose: 149

## 2019-05-20 ENCOUNTER — Encounter: Payer: Self-pay | Admitting: Internal Medicine

## 2019-05-20 NOTE — Telephone Encounter (Signed)
Spoke with patient, He had a COVID test today due to being exposed. He will call us once he has results to set up a CPE.

## 2019-06-02 ENCOUNTER — Encounter: Payer: Self-pay | Admitting: Internal Medicine

## 2019-06-16 ENCOUNTER — Other Ambulatory Visit (INDEPENDENT_AMBULATORY_CARE_PROVIDER_SITE_OTHER): Payer: 59

## 2019-06-16 ENCOUNTER — Encounter: Payer: Self-pay | Admitting: Internal Medicine

## 2019-06-16 ENCOUNTER — Other Ambulatory Visit: Payer: Self-pay

## 2019-06-16 ENCOUNTER — Ambulatory Visit (INDEPENDENT_AMBULATORY_CARE_PROVIDER_SITE_OTHER): Payer: 59 | Admitting: Internal Medicine

## 2019-06-16 VITALS — BP 122/80 | HR 67 | Temp 99.0°F | Ht 69.29 in | Wt 227.0 lb

## 2019-06-16 DIAGNOSIS — Z0000 Encounter for general adult medical examination without abnormal findings: Secondary | ICD-10-CM | POA: Diagnosis not present

## 2019-06-16 DIAGNOSIS — E1169 Type 2 diabetes mellitus with other specified complication: Secondary | ICD-10-CM | POA: Diagnosis not present

## 2019-06-16 DIAGNOSIS — F411 Generalized anxiety disorder: Secondary | ICD-10-CM

## 2019-06-16 DIAGNOSIS — M1A0790 Idiopathic chronic gout, unspecified ankle and foot, without tophus (tophi): Secondary | ICD-10-CM

## 2019-06-16 DIAGNOSIS — Z720 Tobacco use: Secondary | ICD-10-CM

## 2019-06-16 DIAGNOSIS — Z91018 Allergy to other foods: Secondary | ICD-10-CM | POA: Diagnosis not present

## 2019-06-16 DIAGNOSIS — E785 Hyperlipidemia, unspecified: Secondary | ICD-10-CM

## 2019-06-16 LAB — COMPREHENSIVE METABOLIC PANEL
ALT: 22 U/L (ref 0–53)
AST: 15 U/L (ref 0–37)
Albumin: 4.2 g/dL (ref 3.5–5.2)
Alkaline Phosphatase: 48 U/L (ref 39–117)
BUN: 17 mg/dL (ref 6–23)
CO2: 26 mEq/L (ref 19–32)
Calcium: 9.3 mg/dL (ref 8.4–10.5)
Chloride: 105 mEq/L (ref 96–112)
Creatinine, Ser: 0.98 mg/dL (ref 0.40–1.50)
GFR: 78.27 mL/min (ref >60.00)
Glucose, Bld: 195 mg/dL — ABNORMAL HIGH (ref 70–99)
Potassium: 4.2 mEq/L (ref 3.5–5.1)
Sodium: 138 mEq/L (ref 135–145)
Total Bilirubin: 0.3 mg/dL (ref 0.2–1.2)
Total Protein: 6.6 g/dL (ref 6.0–8.3)

## 2019-06-16 LAB — CBC
HCT: 43.6 % (ref 39.0–52.0)
Hemoglobin: 15 g/dL (ref 13.0–17.0)
MCHC: 34.3 g/dL (ref 30.0–36.0)
MCV: 96.3 fl (ref 78.0–100.0)
Platelets: 206 10*3/uL (ref 150.0–400.0)
RBC: 4.53 Mil/uL (ref 4.22–5.81)
RDW: 13.1 % (ref 11.5–15.5)
WBC: 5.9 10*3/uL (ref 4.0–10.5)

## 2019-06-16 LAB — HEMOGLOBIN A1C: Hgb A1c MFr Bld: 7.4 % — ABNORMAL HIGH (ref 4.6–6.5)

## 2019-06-16 LAB — MICROALBUMIN / CREATININE URINE RATIO
Creatinine,U: 126.3 mg/dL
Microalb Creat Ratio: 0.7 mg/g (ref 0.0–30.0)
Microalb, Ur: 0.9 mg/dL (ref 0.0–1.9)

## 2019-06-16 LAB — PSA: PSA: 0.59 ng/mL (ref 0.10–4.00)

## 2019-06-16 MED ORDER — ROSUVASTATIN CALCIUM 10 MG PO TABS: 10.0000 mg | ORAL_TABLET | Freq: Every day | ORAL | 3 refills | Status: DC

## 2019-06-16 MED ORDER — ALPRAZOLAM 0.5 MG PO TABS: 0.5000 mg | ORAL_TABLET | Freq: Two times a day (BID) | ORAL | 2 refills | Status: DC | PRN

## 2019-06-16 NOTE — Patient Instructions (Signed)

## 2019-06-16 NOTE — Progress Notes (Signed)
   Subjective:   Patient ID: Jeffrey Dougherty, male    DOB: 1960-08-15, 58 y.o.   MRN: [id number]  HPI The patient is a 58 YO man coming in for physical.  PMH, [place], social history reviewed and updated  Review of Systems  Constitutional: Negative.   HENT: Negative.   Eyes: Negative.   Respiratory: Negative for cough, chest tightness and shortness of breath.   Cardiovascular: Negative for chest pain, palpitations and leg swelling.  Gastrointestinal: Negative for abdominal distention, abdominal pain, constipation, diarrhea, nausea and vomiting.  Musculoskeletal: Positive for arthralgias.  Skin: Negative.   Neurological: Negative.   Psychiatric/Behavioral: Negative.     Objective:  Physical Exam Constitutional:      Appearance: He is well-developed.  HENT:     Head: Normocephalic and atraumatic.  Cardiovascular:     Rate and Rhythm: Normal rate and regular rhythm.  Pulmonary:     Effort: Pulmonary effort is normal. No respiratory distress.     Breath sounds: Normal breath sounds. No wheezing or rales.  Abdominal:     General: Bowel sounds are normal. There is no distension.     Palpations: Abdomen is soft.     Tenderness: There is no abdominal tenderness. There is no rebound.  Musculoskeletal:     Cervical back: Normal range of motion.  Skin:    General: Skin is warm and dry.  Neurological:     Mental Status: He is alert and oriented to person, place, and time.     Coordination: Coordination normal.     Vitals:   06/16/19 1306  BP: 122/80  Pulse: 67  Temp: 99 F (37.2 C)  TempSrc: Oral  SpO2: 97%  Weight: 227 lb (103 kg)  Height: 5' 9.29" (1.76 m)    This visit occurred during the SARS-CoV-2 public health emergency.  Safety protocols were in place, including screening questions prior to the visit, additional usage of staff PPE, and extensive cleaning of exam room while observing appropriate contact time as indicated for disinfecting solutions.    Assessment & Plan:

## 2019-06-16 NOTE — Progress Notes (Signed)
Abstracted and sent to scan  

## 2019-06-17 NOTE — Assessment & Plan Note (Signed)
Has epi-pen and no red meat in diet.

## 2019-06-17 NOTE — Assessment & Plan Note (Signed)
Takes rare alprazolam and refilled today. Counseled about benefit/risk.

## 2019-06-17 NOTE — Assessment & Plan Note (Signed)
No flare recently and not on meds.

## 2019-06-17 NOTE — Assessment & Plan Note (Signed)
Flu shot declined. Pneumonia declined. Shingrix complete. Tetanus up to date. Colonoscopy up to date. Counseled about sun safety and mole surveillance. Counseled about the dangers of distracted driving. Given 10 year screening recommendations.

## 2019-06-17 NOTE — Assessment & Plan Note (Signed)
Rx crestor and talked about need for heart protection about this. Checking HgA1c, foot exam done. Checking microalbumin to creatinine ratio. Diet controlled currently. Not on ACE-I/ARB.

## 2019-06-17 NOTE — Assessment & Plan Note (Signed)
Quit smoking since last visit. Encouraged lifelong cessation.

## 2019-06-17 NOTE — Assessment & Plan Note (Signed)
Rx crestor as he has never taking simvastatin since prescribed. Will defer lipid panel for 3-6 months as fairly recent with not at goal.

## 2019-10-01 ENCOUNTER — Encounter: Payer: Self-pay | Admitting: Internal Medicine

## 2020-05-28 ENCOUNTER — Encounter (HOSPITAL_COMMUNITY): Payer: Self-pay

## 2020-05-28 ENCOUNTER — Emergency Department (HOSPITAL_COMMUNITY)
Admission: EM | Admit: 2020-05-28 | Discharge: 2020-05-28 | Disposition: A | Discharge status: Home / Self Care | Payer: 59 | Attending: Emergency Medicine | Admitting: Emergency Medicine

## 2020-05-28 ENCOUNTER — Ambulatory Visit (HOSPITAL_COMMUNITY)
Admission: EM | Admit: 2020-05-28 | Discharge: 2020-05-28 | Disposition: A | Discharge status: Home / Self Care | Payer: 59 | Attending: Family Medicine | Admitting: Family Medicine

## 2020-05-28 ENCOUNTER — Emergency Department (HOSPITAL_COMMUNITY): Payer: 59

## 2020-05-28 ENCOUNTER — Encounter (HOSPITAL_COMMUNITY): Payer: Self-pay | Admitting: Emergency Medicine

## 2020-05-28 ENCOUNTER — Other Ambulatory Visit: Payer: Self-pay

## 2020-05-28 DIAGNOSIS — R1032 Left lower quadrant pain: Secondary | ICD-10-CM | POA: Insufficient documentation

## 2020-05-28 DIAGNOSIS — K5792 Diverticulitis of intestine, part unspecified, without perforation or abscess without bleeding: Secondary | ICD-10-CM

## 2020-05-28 DIAGNOSIS — Z87891 Personal history of nicotine dependence: Secondary | ICD-10-CM | POA: Diagnosis not present

## 2020-05-28 DIAGNOSIS — E1122 Type 2 diabetes mellitus with diabetic chronic kidney disease: Secondary | ICD-10-CM | POA: Diagnosis not present

## 2020-05-28 DIAGNOSIS — Z87442 Personal history of urinary calculi: Secondary | ICD-10-CM | POA: Insufficient documentation

## 2020-05-28 DIAGNOSIS — N189 Chronic kidney disease, unspecified: Secondary | ICD-10-CM | POA: Diagnosis not present

## 2020-05-28 LAB — COMPREHENSIVE METABOLIC PANEL
ALT: 24 U/L (ref 0–44)
AST: 23 U/L (ref 15–41)
Albumin: 4 g/dL (ref 3.5–5.0)
Alkaline Phosphatase: 52 U/L (ref 38–126)
Anion gap: 11 (ref 5–15)
BUN: 13 mg/dL (ref 6–20)
CO2: 21 mmol/L — ABNORMAL LOW (ref 22–32)
Calcium: 9.3 mg/dL (ref 8.9–10.3)
Chloride: 102 mmol/L (ref 98–111)
Creatinine, Ser: 1.01 mg/dL (ref 0.61–1.24)
GFR, Estimated: >60 mL/min (ref >60)
Glucose, Bld: 144 mg/dL — ABNORMAL HIGH (ref 70–99)
Potassium: 4.2 mmol/L (ref 3.5–5.1)
Sodium: 134 mmol/L — ABNORMAL LOW (ref 135–145)
Total Bilirubin: 0.8 mg/dL (ref 0.3–1.2)
Total Protein: 7.1 g/dL (ref 6.5–8.1)

## 2020-05-28 LAB — POCT URINALYSIS DIPSTICK, ED / UC
Bilirubin Urine: NEGATIVE
Glucose, UA: NEGATIVE mg/dL
Hgb urine dipstick: NEGATIVE
Ketones, ur: NEGATIVE mg/dL
Leukocytes,Ua: NEGATIVE
Nitrite: NEGATIVE
Protein, ur: NEGATIVE mg/dL
Specific Gravity, Urine: 1.01 (ref 1.005–1.030)
Urobilinogen, UA: 0.2 mg/dL (ref 0.0–1.0)
pH: 5 (ref 5.0–8.0)

## 2020-05-28 LAB — LIPASE, BLOOD: Lipase: 36 U/L (ref 11–51)

## 2020-05-28 LAB — CBC
HCT: 46 % (ref 39.0–52.0)
Hemoglobin: 15.6 g/dL (ref 13.0–17.0)
MCH: 33 pg (ref 26.0–34.0)
MCHC: 33.9 g/dL (ref 30.0–36.0)
MCV: 97.3 fL (ref 80.0–100.0)
Platelets: 173 10*3/uL (ref 150–400)
RBC: 4.73 MIL/uL (ref 4.22–5.81)
RDW: 12.3 % (ref 11.5–15.5)
WBC: 9.8 10*3/uL (ref 4.0–10.5)
nRBC: 0 % (ref 0.0–0.2)

## 2020-05-28 MED ORDER — HYDROCODONE-ACETAMINOPHEN 5-325 MG PO TABS
2.0000 | ORAL_TABLET | Freq: Four times a day (QID) | ORAL | 0 refills | Status: AC | PRN
Start: 2020-05-28 — End: 2020-06-02

## 2020-05-28 MED ORDER — IOHEXOL 300 MG/ML  SOLN
100.0000 mL | Freq: Once | INTRAMUSCULAR | Status: AC | PRN
  Administered 2020-05-28: 100 mL via INTRAVENOUS

## 2020-05-28 MED ORDER — AMOXICILLIN-POT CLAVULANATE 875-125 MG PO TABS
1.0000 | ORAL_TABLET | Freq: Once | ORAL | Status: AC
  Administered 2020-05-28: 1 via ORAL
  Filled 2020-05-28: qty 1

## 2020-05-28 MED ORDER — ONDANSETRON HCL 4 MG/2ML IJ SOLN: 4.0000 mg | Freq: Once | INTRAMUSCULAR | Status: DC | PRN

## 2020-05-28 MED ORDER — KETOROLAC TROMETHAMINE 30 MG/ML IJ SOLN
INTRAMUSCULAR | Status: AC
  Filled 2020-05-28: qty 1

## 2020-05-28 MED ORDER — KETOROLAC TROMETHAMINE 30 MG/ML IJ SOLN
30.0000 mg | Freq: Once | INTRAMUSCULAR | Status: AC
  Administered 2020-05-28: 30 mg via INTRAMUSCULAR

## 2020-05-28 MED ORDER — AMOXICILLIN-POT CLAVULANATE 875-125 MG PO TABS: 1.0000 | ORAL_TABLET | Freq: Two times a day (BID) | ORAL | 0 refills | Status: AC

## 2020-05-28 MED ORDER — HYDROMORPHONE HCL 1 MG/ML IJ SOLN: 0.5000 mg | INTRAMUSCULAR | Status: DC | PRN

## 2020-05-28 NOTE — ED Triage Notes (Signed)
C/o abdominal pain llq since yesterday. Sent by [facility]. Received Toradol @ [facility].

## 2020-05-28 NOTE — ED Triage Notes (Signed)
Pt reports left lower quadrant pain x 1 day, nausea stated today. States he called a triage nurse and was told be evaluated for diverticulitis.Siting worsens the pain. Pt states few years ago he was told he was at risk for diverticulitis 5 years ago aprox.  Denies fever, dysuria, diarrhea, back pain.

## 2020-05-28 NOTE — Discharge Instructions (Addendum)
Advance your diet slowed (starting with clear liquids and progressing to other liquids and bland food).  Take the antibiotic for the full 10 days.

## 2020-05-28 NOTE — ED Notes (Signed)
Pt transported to CT at this time.

## 2020-05-28 NOTE — ED Notes (Signed)
Pt back from CT

## 2020-05-28 NOTE — Discharge Instructions (Signed)
Please go to emergency room for imaging  - severe left lower quadrant abdominal pain, concern for diverticulitis

## 2020-05-28 NOTE — ED Notes (Signed)
Reviewed discharge instructions with patient. Follow-up care and medications reviewed. Patient  verbalized understanding. Patient A&Ox4, VSS, and ambulatory with steady gait upon discharge.  °

## 2020-05-28 NOTE — ED Notes (Signed)
Pt refusing to put on hospital gown.

## 2020-05-28 NOTE — ED Provider Notes (Signed)
Hx diverticulosis on colonoscopy, LLQ pain, worse with movement/walking. Labs ok, UA dipstick at UC ok.  Plan - F/u CT - Plan for home on augmentin if has diverticulitis  Physical Exam  BP 127/86    Pulse 72    Temp (!) 97.5 F (36.4 C) (Oral)    Resp 16    SpO2 94%   Physical Exam Constitutional:      General: He is not in acute distress. Neurological:     Mental Status: He is alert.     Gait: Gait is intact.     ED Course/Procedures      Procedures  MDM   CT abdomen pelvis with sigmoid diverticulitis but no diverticular abscess or perforation.  Dilaudid and Zofran ordered by previous provider but not given.  Patient frustrated with wait times and would like to leave.  Discussed diagnosis of sigmoid diverticulitis and plan for discharge home with antibiotics.  Patient minimal to this plan.  Patient again voices frustration with the extended wait times and feeling as though he had difficulty having nursing staff come into the room.  I apologized for the frustration and asked that there is anything that I could do -patient stated that he would like to be discharged home.  Discussed expected pain course, strict return precautions, and outpatient follow-up with PCP.  Patient voiced understanding.  Given initial dose of Augmentin in the ED.  Norco for pain sent to preferred pharmacy.  Discussed diet and advancing slowly as tolerated.  Discharged in stable condition.            Darrick Huntsman, MD 05/29/20 0129    Sharlett Iles, MD 05/30/20 [phone]

## 2020-05-28 NOTE — ED Notes (Signed)
Per CT they will get pt shortly

## 2020-05-28 NOTE — ED Provider Notes (Signed)
Jeffrey Dougherty   CSN: [id number] Arrival date & time: 05/28/20  1053     History Chief Complaint  Patient presents with  . Abdominal Pain    Jeffrey Dougherty is a 59 y.o. male.  Patient with history of kidney stones, diverticulosis without history of diverticulitis, previous appendectomy --presents to the emergency department for worsening left lower quadrant abdominal pain.  Patient had minimal symptoms last night.  He states that he was up to the restroom several times last night but did not notice any blood or pain with urination.  Today the symptoms gradually worsened to the point where he is having trouble walking.  Patient was seen at urgent care and was transferred to the emergency department for further evaluation.  He was given Toradol prior to arrival and had a urine dipstick which was completely negative.  Patient states that this pain is different than previous kidney stone pain.  No bloody or tarry stools.  No fevers, chest pain or shortness of breath.  Denies injuries.  Onset of symptoms acute.  Nothing makes symptoms better.        Past Medical History:  Diagnosis Date  . Anal or rectal pain 01/2009  . Anxiety state, unspecified   . Arthritis   . Chronic kidney disease    kidney stones  . External otitis   . History of chickenpox   . Hyperlipidemia   . Nonspecific elevation of levels of transaminase or lactic acid dehydrogenase (LDH)   . Pneumonia   . Pneumonia, organism unspecified(486)   . Preglaucoma, unspecified   . Prostatitis   . Temporomandibular joint disorders, unspecified     Patient Active Problem List   Diagnosis Date Noted  . Allergy to alpha-gal 04/18/2018  . Cough 04/18/2018  . Type 2 diabetes mellitus with other specified complication ([place]) 72/53/6644  . Tinnitus of both ears 02/20/2018  . Tobacco abuse 08/09/2015  . Routine health maintenance 03/31/2012  . Gout 07/25/2010  .  Obesity 07/25/2010  . Anxiety state 02/09/2009  . Hyperlipidemia associated with type 2 diabetes mellitus ([place]) 07/22/2007    Past Surgical History:  Procedure Laterality Date  . APPENDECTOMY    . COLONOSCOPY    . POLYPECTOMY    . TONSILLECTOMY    . tracheotomy    . VASECTOMY         Family History  Problem Relation Age of Onset  . Alcohol abuse Mother   . Hip fracture Mother   . Cancer Mother        breast  . Breast cancer Mother   . Allergic rhinitis Mother   . Cancer Father        Prostate cancer  . Arthritis Father        rheumatoid  . Colon polyps Father   . Prostate cancer Father   . Pancreatic cancer Brother   . Colon polyps Brother   . Coronary artery disease Neg Hx   . Stroke Neg Hx   . Colon cancer Neg Hx   . Esophageal cancer Neg Hx   . Stomach cancer Neg Hx   . Rectal cancer Neg Hx     Social History   Tobacco Use  . Smoking status: Former Smoker    Packs/day: 0.50    Types: Cigarettes  . Smokeless tobacco: Never Used  Vaping Use  . Vaping Use: Never used  Substance Use Topics  . Alcohol use: Yes    Alcohol/week: 48.0 standard  drinks    Types: 48 Cans of beer per week  . Drug use: No    Home Medications Prior to Admission medications   Medication Sig Start Date End Date Taking? Authorizing Provider  ALPRAZolam Duanne Moron) 0.5 MG tablet Take 1 tablet (0.5 mg total) by mouth 2 (two) times daily as needed. Patient taking differently: Take 0.5 mg by mouth 2 (two) times daily as needed for anxiety.  06/16/19   Hoyt Koch, MD  EPINEPHrine 0.3 mg/0.3 mL IJ SOAJ injection Inject 0.3 mg into the muscle as needed for anaphylaxis.  04/15/18   [provider]  rosuvastatin (CRESTOR) 10 MG tablet Take 1 tablet (10 mg total) by mouth daily. 06/16/19   Hoyt Koch, MD    Allergies    Ciprofloxacin  Review of Systems   Review of Systems  Constitutional: Negative for fever.  HENT: Negative for rhinorrhea and sore throat.     Eyes: Negative for redness.  Respiratory: Negative for cough.   Cardiovascular: Negative for chest pain.  Gastrointestinal: Positive for abdominal pain. Negative for blood in stool, diarrhea, nausea and vomiting.  Genitourinary: Negative for dysuria and hematuria.  Musculoskeletal: Negative for myalgias.  Skin: Negative for rash.  Neurological: Negative for headaches.    Physical Exam Updated Vital Signs BP 132/79 (BP Location: Right Arm)   Pulse 74   Temp (!) 97.5 F (36.4 C) (Oral)   Resp 20   SpO2 94%   Physical Exam Vitals and nursing Dougherty reviewed.  Constitutional:      Appearance: He is well-developed.  HENT:     Head: Normocephalic and atraumatic.  Eyes:     General:        Right eye: No discharge.        Left eye: No discharge.     Conjunctiva/sclera: Conjunctivae normal.  Cardiovascular:     Rate and Rhythm: Normal rate and regular rhythm.     Heart sounds: Normal heart sounds.  Pulmonary:     Effort: Pulmonary effort is normal.     Breath sounds: Normal breath sounds.  Abdominal:     Palpations: Abdomen is soft.     Tenderness: There is abdominal tenderness (Moderate focal left lower quadrant abdominal pain, patient winces with palpation in this area) in the left lower quadrant. There is no guarding or rebound.  Musculoskeletal:     Cervical back: Normal range of motion and neck supple.  Skin:    General: Skin is warm and dry.  Neurological:     Mental Status: He is alert.     ED Results / Procedures / Treatments   Labs (all labs ordered are listed, but only abnormal results are displayed) Labs Reviewed  COMPREHENSIVE METABOLIC PANEL - Abnormal; Notable for the following components:      Result Value   Sodium 134 (*)    CO2 21 (*)    Glucose, Bld 144 (*)    All other components within normal limits  LIPASE, BLOOD  CBC    EKG None  Radiology No results found.  Procedures Procedures (including critical care time)  Medications Ordered in  ED Medications - No data to display  ED Course  I have reviewed the triage vital signs and the nursing notes.  Pertinent labs & imaging results that were available during my care of the patient were reviewed by me and considered in my medical decision making (see chart for details).  Patient seen and examined. Work-up/CT initiated. Medications ordered.  Vital signs reviewed and are as follows: BP 132/79 (BP Location: Right Arm)   Pulse 74   Temp (!) 97.5 F (36.4 C) (Oral)   Resp 20   SpO2 94%   Pt continues to pend CT imaging.  I updated patient and family at bedside on lab results.  3:39 PM signout to Dr. Lurena Nida at shift change.  Plan: Follow-up on CT results.  If uncomplicated diverticulitis, can likely go home on oral antibiotics.  Otherwise, treat per findings.     MDM Rules/Calculators/A&P                          Pending completion of work-up.   Final Clinical Impression(s) / ED Diagnoses Final diagnoses:  Left lower quadrant pain    Rx / DC Orders ED Discharge Orders    None       Carlisle Cater, PA-C 05/28/20 [place], DO 05/28/20 1543

## 2020-05-28 NOTE — ED Notes (Signed)
Resident provider at bedside

## 2020-05-28 NOTE — ED Notes (Signed)
Pt ambulatory to the bathroom 

## 2020-05-28 NOTE — ED Provider Notes (Signed)
[place]    CSN: [id number] Arrival date & time: 05/28/20  0962      History   Chief Complaint Chief Complaint  Patient presents with  . Abdominal Pain  . Nausea    HPI Jeffrey Dougherty is a 59 y.o. male history of DM type II, hyperlipidemia, tobacco use, presenting today for evaluation of abdominal pain.  Reports began to develop left lower quadrant abdominal pain last night and has progressively worsened since.  Even since being at urgent care he has had worsening pain in the past 10 minutes.  Has had some mild associated nausea.  Denies vomiting.  Denies any known fevers.  Had smaller bowel movement than normal this morning.  Has known diverticulosis on last colonoscopy in 2019, but has never had diverticulitis documented on imaging.  Has had prior kidney stones, but feels different, denies back pain.  Denies any urinary symptoms.     Past Medical History:  Diagnosis Date  . Anal or rectal pain 01/2009  . Anxiety state, unspecified   . Arthritis   . Chronic kidney disease    kidney stones  . External otitis   . History of chickenpox   . Hyperlipidemia   . Nonspecific elevation of levels of transaminase or lactic acid dehydrogenase (LDH)   . Pneumonia   . Pneumonia, organism unspecified(486)   . Preglaucoma, unspecified   . Prostatitis   . Temporomandibular joint disorders, unspecified     Patient Active Problem List   Diagnosis Date Noted  . Allergy to alpha-gal 04/18/2018  . Cough 04/18/2018  . Type 2 diabetes mellitus with other specified complication ([place]) 83/66/2947  . Tinnitus of both ears 02/20/2018  . Tobacco abuse 08/09/2015  . Routine health maintenance 03/31/2012  . Gout 07/25/2010  . Obesity 07/25/2010  . Anxiety state 02/09/2009  . Hyperlipidemia associated with type 2 diabetes mellitus ([place]) 07/22/2007    Past Surgical History:  Procedure Laterality Date  . APPENDECTOMY    . COLONOSCOPY    . POLYPECTOMY    . TONSILLECTOMY     . tracheotomy    . VASECTOMY         Home Medications    Prior to Admission medications   Medication Sig Start Date End Date Taking? Authorizing Provider  ALPRAZolam Duanne Moron) 0.5 MG tablet Take 1 tablet (0.5 mg total) by mouth 2 (two) times daily as needed. 06/16/19   Hoyt Koch, MD  EPINEPHrine 0.3 mg/0.3 mL IJ SOAJ injection  04/15/18   [provider]  rosuvastatin (CRESTOR) 10 MG tablet Take 1 tablet (10 mg total) by mouth daily. 06/16/19   Hoyt Koch, MD    Family History Family History  Problem Relation Age of Onset  . Alcohol abuse Mother   . Hip fracture Mother   . Cancer Mother        breast  . Breast cancer Mother   . Allergic rhinitis Mother   . Cancer Father        Prostate cancer  . Arthritis Father        rheumatoid  . Colon polyps Father   . Prostate cancer Father   . Pancreatic cancer Brother   . Colon polyps Brother   . Coronary artery disease Neg Hx   . Stroke Neg Hx   . Colon cancer Neg Hx   . Esophageal cancer Neg Hx   . Stomach cancer Neg Hx   . Rectal cancer Neg Hx     Social  History Social History   Tobacco Use  . Smoking status: Former Smoker    Packs/day: 0.50    Types: Cigarettes  . Smokeless tobacco: Never Used  Vaping Use  . Vaping Use: Never used  Substance Use Topics  . Alcohol use: Yes    Alcohol/week: 48.0 standard drinks    Types: 48 Cans of beer per week  . Drug use: No     Allergies   Ciprofloxacin   Review of Systems Review of Systems  Constitutional: Negative for fatigue and fever.  HENT: Negative for congestion, sinus pressure and sore throat.   Eyes: Negative for photophobia, pain and visual disturbance.  Respiratory: Negative for cough and shortness of breath.   Cardiovascular: Negative for chest pain.  Gastrointestinal: Positive for abdominal pain and nausea. Negative for vomiting.  Genitourinary: Negative for decreased urine volume, difficulty urinating, discharge,  frequency, hematuria, penile pain, penile swelling, scrotal swelling and testicular pain.  Musculoskeletal: Negative for myalgias, neck pain and neck stiffness.  Skin: Negative for rash.  Neurological: Negative for dizziness, syncope, facial asymmetry, speech difficulty, weakness, light-headedness, numbness and headaches.     Physical Exam Triage Vital Signs ED Triage Vitals  Enc Vitals Group     BP 05/28/20 0948 124/85     Pulse Rate 05/28/20 0948 69     Resp 05/28/20 0948 19     Temp 05/28/20 0948 97.9 F (36.6 C)     Temp Source 05/28/20 0948 Oral     SpO2 05/28/20 0948 97 %     Weight --      Height --      Head Circumference --      Peak Flow --      Pain Score 05/28/20 0946 8     Pain Loc --      Pain Edu? --      Excl. in [place]? --    No data found.  Updated Vital Signs BP 124/85 (BP Location: Right Arm)   Pulse 69   Temp 97.9 F (36.6 C) (Oral)   Resp 19   SpO2 97%   Visual Acuity Right Eye Distance:   Left Eye Distance:   Bilateral Distance:    Right Eye Near:   Left Eye Near:    Bilateral Near:     Physical Exam Vitals and nursing note reviewed.  Constitutional:      Appearance: He is well-developed.     Comments: Appears uncomfortable, standing for comfort  HENT:     Head: Normocephalic and atraumatic.     Nose: Nose normal.  Eyes:     Conjunctiva/sclera: Conjunctivae normal.  Cardiovascular:     Rate and Rhythm: Normal rate and regular rhythm.  Pulmonary:     Effort: Pulmonary effort is normal. No respiratory distress.     Comments: Breathing comfortably at rest, CTABL, no wheezing, rales or other adventitious sounds auscultated Abdominal:     Comments: Soft, mildly distended, tender to palpation to left lower quadrant, positive rebound  Musculoskeletal:        General: Normal range of motion.     Cervical back: Neck supple.  Skin:    General: Skin is warm and dry.  Neurological:     Mental Status: He is alert and oriented to person,  place, and time.      UC Treatments / Results  Labs (all labs ordered are listed, but only abnormal results are displayed) Labs Reviewed  POCT URINALYSIS DIPSTICK, ED / UC  EKG   Radiology No results found.  Procedures Procedures (including critical care time)  Medications Ordered in UC Medications  ketorolac (TORADOL) 30 MG/ML injection 30 mg (30 mg Intramuscular Given 05/28/20 1042)    Initial Impression / Assessment and Plan / UC Course  I have reviewed the triage vital signs and the nursing notes.  Pertinent labs & imaging results that were available during my care of the patient were reviewed by me and considered in my medical decision making (see chart for details).     UA unremarkable, negative hemoglobin, suspicious of kidney stone.  No suspicious of diverticulitis or other intra-abdominal source.  Given progressively worsening pain over the past 24 hours and no documented history of diverticulitis recommending further imaging and evaluation in emergency room with CT.  Provided Toradol prior to discharge to help with discomfort.  Stable on discharge, sent independently.   Patient verbalized understanding and is agreeable with plan.  Final Clinical Impressions(s) / UC Diagnoses   Final diagnoses:  Left lower quadrant abdominal pain     Discharge Instructions     Please go to emergency room for imaging  - severe left lower quadrant abdominal pain, concern for diverticulitis    ED Prescriptions    None     PDMP not reviewed this encounter.   Kolette Vey, [place] C, PA-C 05/28/20 1049

## 2020-05-28 NOTE — ED Notes (Signed)
Called CT to find out ETA on pt's scan

## 2020-05-31 ENCOUNTER — Encounter: Payer: Self-pay | Admitting: Internal Medicine

## 2020-05-31 ENCOUNTER — Ambulatory Visit: Payer: 59 | Admitting: Internal Medicine

## 2020-05-31 ENCOUNTER — Telehealth: Payer: Self-pay | Admitting: Internal Medicine

## 2020-05-31 ENCOUNTER — Other Ambulatory Visit: Payer: Self-pay

## 2020-05-31 VITALS — HR 67 | Temp 98.1°F | Ht 69.29 in | Wt 213.0 lb

## 2020-05-31 DIAGNOSIS — Z23 Encounter for immunization: Secondary | ICD-10-CM

## 2020-05-31 DIAGNOSIS — K5792 Diverticulitis of intestine, part unspecified, without perforation or abscess without bleeding: Secondary | ICD-10-CM | POA: Diagnosis not present

## 2020-05-31 NOTE — Patient Instructions (Signed)
Keep taking the augmentin until gone.

## 2020-05-31 NOTE — Progress Notes (Signed)
   Subjective:   Patient ID: Jeffrey Dougherty, male    DOB: 1960-12-27, 59 y.o.   MRN: [id number]  HPI The patient is a 59 YO man coming in for ER follow up (abdominal pain, CT consistent with diverticulitis). He had a poor experience in the ER with wait time and competence and wants second interpretation of his results from there. He is taking the augmentin for about 2-3 days now and abdominal pain is improving some. He still has pain with change in position. Is eating and drinking well. BM finally solid yesterday which was better. Previously diarrhea. Denies N/V.  PMH, [place], social history reviewed and updated  Review of Systems  Constitutional: Positive for activity change.  HENT: Negative.   Eyes: Negative.   Respiratory: Negative for cough, chest tightness and shortness of breath.   Cardiovascular: Negative for chest pain, palpitations and leg swelling.  Gastrointestinal: Positive for abdominal pain and diarrhea. Negative for abdominal distention, constipation, nausea and vomiting.  Musculoskeletal: Negative.   Skin: Negative.   Neurological: Negative.   Psychiatric/Behavioral: Negative.     Objective:  Physical Exam Constitutional:      Appearance: He is well-developed.  HENT:     Head: Normocephalic and atraumatic.  Cardiovascular:     Rate and Rhythm: Normal rate and regular rhythm.  Pulmonary:     Effort: Pulmonary effort is normal. No respiratory distress.     Breath sounds: Normal breath sounds. No wheezing or rales.  Abdominal:     General: Bowel sounds are normal. There is no distension.     Palpations: Abdomen is soft.     Tenderness: There is abdominal tenderness. There is no rebound.     Comments: Tenderness LLQ without radiation or guarding, normal BS  Musculoskeletal:     Cervical back: Normal range of motion.  Skin:    General: Skin is warm and dry.  Neurological:     Mental Status: He is alert and oriented to person, place, and time.      Coordination: Coordination normal.     Vitals:   05/31/20 0943  Pulse: 67  Temp: 98.1 F (36.7 C)  TempSrc: Oral  SpO2: 96%  Weight: 213 lb (96.6 kg)  Height: 5' 9.29" (1.76 m)    This visit occurred during the SARS-CoV-2 public health emergency.  Safety protocols were in place, including screening questions prior to the visit, additional usage of staff PPE, and extensive cleaning of exam room while observing appropriate contact time as indicated for disinfecting solutions.   Assessment & Plan:  Flu shot given at visit  Visit time 25 minutes in face to face communication with patient and coordination of care, additional 5 minutes spent in record review, coordination or care, ordering tests, communicating/referring to other healthcare professionals, documenting in medical records all on the same day of the visit for total time 30 minutes spent on the visit.

## 2020-05-31 NOTE — Telephone Encounter (Signed)
Patient called [facility] on 05/28/2020  Caller states he is having severe pain in abdomen, left lower side  [facility] advised to See HCP within 4 Hours (or PCP Triage)  Patient understood and decided to go to the ED.

## 2020-06-02 DIAGNOSIS — K5792 Diverticulitis of intestine, part unspecified, without perforation or abscess without bleeding: Secondary | ICD-10-CM | POA: Insufficient documentation

## 2020-06-02 NOTE — Assessment & Plan Note (Signed)
Reviewed imaging and lab results with patient and concur with diagnosis of diverticulitis. He is up to date on colonoscopy. Advised to finish course of augmentin until gone. Abdominal pain should continue to gradually improve until gone and BM should go back to normal within 1-2 weeks. Call back for worsening or lack of improvement.

## 2020-06-29 ENCOUNTER — Telehealth: Payer: Self-pay | Admitting: Internal Medicine

## 2020-06-29 ENCOUNTER — Encounter: Payer: 59 | Admitting: Internal Medicine

## 2020-06-29 ENCOUNTER — Other Ambulatory Visit: Payer: 59

## 2020-06-29 DIAGNOSIS — E1169 Type 2 diabetes mellitus with other specified complication: Secondary | ICD-10-CM

## 2020-06-29 NOTE — Telephone Encounter (Signed)
Patient is requesting lab orders for his physical.

## 2020-06-30 ENCOUNTER — Encounter: Payer: 59 | Admitting: Internal Medicine

## 2020-06-30 NOTE — Telephone Encounter (Signed)
I do not see an upcoming physical scheduled, does he have one scheduled?

## 2020-07-01 NOTE — Telephone Encounter (Signed)
I called pt- he states he has had a known positive Covid exposure. He has cancelled his physical for now. He will call us back once he has negative results.

## 2020-07-29 NOTE — Telephone Encounter (Signed)
See below

## 2020-07-29 NOTE — Telephone Encounter (Signed)
Please call and schedule the patient a lab appt. Orders have been placed by Dr. Sharlet Salina.

## 2020-07-29 NOTE — Addendum Note (Signed)
Addended by: Pricilla Holm A on: 07/29/2020 10:26 AM   Modules accepted: Orders

## 2020-07-29 NOTE — Telephone Encounter (Signed)
    Patient made aware orders in system Patient states he plans to get labs at [facility] location

## 2020-07-29 NOTE — Telephone Encounter (Signed)
Patient has a physical now scheduled for Feb 10th and is wondering if he can get his labs done before hand

## 2020-07-29 NOTE — Telephone Encounter (Signed)
Labs placed to get done prior to physical

## 2020-08-06 ENCOUNTER — Other Ambulatory Visit (INDEPENDENT_AMBULATORY_CARE_PROVIDER_SITE_OTHER): Payer: 59

## 2020-08-06 DIAGNOSIS — E1169 Type 2 diabetes mellitus with other specified complication: Secondary | ICD-10-CM | POA: Diagnosis not present

## 2020-08-06 LAB — COMPREHENSIVE METABOLIC PANEL
ALT: 26 U/L (ref 0–53)
AST: 37 U/L (ref 0–37)
Albumin: 4.2 g/dL (ref 3.5–5.2)
Alkaline Phosphatase: 49 U/L (ref 39–117)
BUN: 17 mg/dL (ref 6–23)
CO2: 25 mEq/L (ref 19–32)
Calcium: 9.7 mg/dL (ref 8.4–10.5)
Chloride: 103 mEq/L (ref 96–112)
Creatinine, Ser: 1.04 mg/dL (ref 0.40–1.50)
GFR: 78.22 mL/min (ref >60.00)
Glucose, Bld: 162 mg/dL — ABNORMAL HIGH (ref 70–99)
Potassium: 4.3 mEq/L (ref 3.5–5.1)
Sodium: 137 mEq/L (ref 135–145)
Total Bilirubin: 0.5 mg/dL (ref 0.2–1.2)
Total Protein: 7.2 g/dL (ref 6.0–8.3)

## 2020-08-06 LAB — LIPID PANEL
Cholesterol: 254 mg/dL — ABNORMAL HIGH (ref 0–200)
HDL: 57.7 mg/dL (ref >39.00)
LDL Cholesterol: 163 mg/dL — ABNORMAL HIGH (ref 0–99)
NonHDL: 196.48
Total CHOL/HDL Ratio: 4
Triglycerides: 168 mg/dL — ABNORMAL HIGH (ref 0.0–149.0)
VLDL: 33.6 mg/dL (ref 0.0–40.0)

## 2020-08-06 LAB — CBC
HCT: 46.2 % (ref 39.0–52.0)
Hemoglobin: 15.8 g/dL (ref 13.0–17.0)
MCHC: 34.1 g/dL (ref 30.0–36.0)
MCV: 96.5 fl (ref 78.0–100.0)
Platelets: 204 10*3/uL (ref 150.0–400.0)
RBC: 4.79 Mil/uL (ref 4.22–5.81)
RDW: 13.4 % (ref 11.5–15.5)
WBC: 6.2 10*3/uL (ref 4.0–10.5)

## 2020-08-06 LAB — MICROALBUMIN / CREATININE URINE RATIO
Creatinine,U: 223.2 mg/dL
Microalb Creat Ratio: 1.2 mg/g (ref 0.0–30.0)
Microalb, Ur: 2.6 mg/dL — ABNORMAL HIGH (ref 0.0–1.9)

## 2020-08-06 LAB — HEMOGLOBIN A1C: Hgb A1c MFr Bld: 7.3 % — ABNORMAL HIGH (ref 4.6–6.5)

## 2020-08-12 ENCOUNTER — Ambulatory Visit (INDEPENDENT_AMBULATORY_CARE_PROVIDER_SITE_OTHER): Payer: 59 | Admitting: Internal Medicine

## 2020-08-12 ENCOUNTER — Other Ambulatory Visit: Payer: Self-pay

## 2020-08-12 ENCOUNTER — Encounter: Payer: Self-pay | Admitting: Internal Medicine

## 2020-08-12 VITALS — BP 124/80 | HR 62 | Temp 98.3°F | Resp 18 | Ht 69.29 in | Wt 217.0 lb

## 2020-08-12 DIAGNOSIS — E1169 Type 2 diabetes mellitus with other specified complication: Secondary | ICD-10-CM

## 2020-08-12 DIAGNOSIS — E785 Hyperlipidemia, unspecified: Secondary | ICD-10-CM

## 2020-08-12 DIAGNOSIS — M1A0790 Idiopathic chronic gout, unspecified ankle and foot, without tophus (tophi): Secondary | ICD-10-CM | POA: Diagnosis not present

## 2020-08-12 DIAGNOSIS — Z0000 Encounter for general adult medical examination without abnormal findings: Secondary | ICD-10-CM

## 2020-08-12 DIAGNOSIS — F411 Generalized anxiety disorder: Secondary | ICD-10-CM

## 2020-08-12 LAB — URIC ACID: Uric Acid, Serum: 7.7 mg/dL (ref 4.0–7.8)

## 2020-08-12 LAB — PSA: PSA: 0.77 ng/mL (ref 0.10–4.00)

## 2020-08-12 MED ORDER — ROSUVASTATIN CALCIUM 10 MG PO TABS
10.0000 mg | ORAL_TABLET | Freq: Every day | ORAL | 3 refills | Status: DC
Start: 2020-08-12 — End: 2021-08-08

## 2020-08-12 MED ORDER — ALPRAZOLAM 0.5 MG PO TABS
0.5000 mg | ORAL_TABLET | Freq: Every day | ORAL | 2 refills | Status: DC | PRN
Start: 2020-08-12 — End: 2022-02-10

## 2020-08-12 NOTE — Assessment & Plan Note (Signed)
Diet controlled, foot exam done. Recent labs reviewed with patient. He will resume taking crestor 10 mg daily as he had not been taking consistently. Recent microalbumin to creatinine ratio without indication to start ACE-I or ARB but if BP elevates would be preferred first option.

## 2020-08-12 NOTE — Progress Notes (Signed)
   Subjective:   Patient ID: Jeffrey Dougherty, male    DOB: 02-11-1961, 60 y.o.   MRN: [id number]  HPI The patient is a 60 YO man coming in for physical. Recent labs.   PMH, [facility], social history reviewed and updated  Review of Systems  Constitutional: Negative.   HENT: Negative.   Eyes: Negative.   Respiratory: Negative for cough, chest tightness and shortness of breath.   Cardiovascular: Negative for chest pain, palpitations and leg swelling.  Gastrointestinal: Negative for abdominal distention, abdominal pain, constipation, diarrhea, nausea and vomiting.  Musculoskeletal: Negative.   Skin: Negative.   Neurological: Negative.   Psychiatric/Behavioral: Negative.     Objective:  Physical Exam Constitutional:      Appearance: He is well-developed and well-nourished.  HENT:     Head: Normocephalic and atraumatic.  Eyes:     Extraocular Movements: EOM normal.  Cardiovascular:     Rate and Rhythm: Normal rate and regular rhythm.  Pulmonary:     Effort: Pulmonary effort is normal. No respiratory distress.     Breath sounds: Normal breath sounds. No wheezing or rales.  Abdominal:     General: Bowel sounds are normal. There is no distension.     Palpations: Abdomen is soft.     Tenderness: There is no abdominal tenderness. There is no rebound.  Musculoskeletal:        General: No edema.     Cervical back: Normal range of motion.  Skin:    General: Skin is warm and dry.  Neurological:     Mental Status: He is alert and oriented to person, place, and time.     Coordination: Coordination normal.  Psychiatric:        Mood and Affect: Mood and affect normal.     Vitals:   08/12/20 0909  BP: 124/80  Pulse: 62  Resp: 18  Temp: 98.3 F (36.8 C)  TempSrc: Oral  SpO2: 96%  Weight: 217 lb (98.4 kg)  Height: 5' 9.29" (1.76 m)    This visit occurred during the SARS-CoV-2 public health emergency.  Safety protocols were in place, including screening questions prior to  the visit, additional usage of staff PPE, and extensive cleaning of exam room while observing appropriate contact time as indicated for disinfecting solutions.   Assessment & Plan:

## 2020-08-12 NOTE — Assessment & Plan Note (Signed)
Refill alprazolam which he uses rarely.

## 2020-08-12 NOTE — Assessment & Plan Note (Signed)
Flu shot up to date. Covid-19 2 shots encouraged booster. Shingrix complete. Tetanus up to date. Colonoscopy up to date. Counseled about sun safety and mole surveillance. Counseled about the dangers of distracted driving. Given 10 year screening recommendations.

## 2020-08-12 NOTE — Assessment & Plan Note (Signed)
Checking uric acid. We talked about diet modifications. Last flare >6 months ago.

## 2020-08-12 NOTE — Assessment & Plan Note (Signed)
Had not been taking crestor 10 mg daily but will resume. Discussed CV risk 17% 10 year.

## 2020-08-12 NOTE — Patient Instructions (Signed)

## 2021-08-06 ENCOUNTER — Other Ambulatory Visit: Payer: Self-pay | Admitting: Internal Medicine

## 2021-08-23 ENCOUNTER — Other Ambulatory Visit: Payer: Self-pay | Admitting: Internal Medicine

## 2021-09-07 ENCOUNTER — Telehealth: Payer: Self-pay | Admitting: Internal Medicine

## 2021-09-07 DIAGNOSIS — M1A0790 Idiopathic chronic gout, unspecified ankle and foot, without tophus (tophi): Secondary | ICD-10-CM

## 2021-09-07 DIAGNOSIS — E785 Hyperlipidemia, unspecified: Secondary | ICD-10-CM

## 2021-09-07 DIAGNOSIS — E1169 Type 2 diabetes mellitus with other specified complication: Secondary | ICD-10-CM

## 2021-09-07 NOTE — Telephone Encounter (Signed)
Pt requesting an order for labs prior to cpe appt on 09-13-2021 ?

## 2021-09-08 NOTE — Telephone Encounter (Signed)
Labs ordered.

## 2021-09-09 ENCOUNTER — Other Ambulatory Visit (INDEPENDENT_AMBULATORY_CARE_PROVIDER_SITE_OTHER): Payer: Self-pay

## 2021-09-09 DIAGNOSIS — E1169 Type 2 diabetes mellitus with other specified complication: Secondary | ICD-10-CM

## 2021-09-09 DIAGNOSIS — E785 Hyperlipidemia, unspecified: Secondary | ICD-10-CM

## 2021-09-09 DIAGNOSIS — M1A0790 Idiopathic chronic gout, unspecified ankle and foot, without tophus (tophi): Secondary | ICD-10-CM

## 2021-09-09 LAB — CBC
HCT: 44.7 % (ref 39.0–52.0)
Hemoglobin: 15.1 g/dL (ref 13.0–17.0)
MCHC: 33.7 g/dL (ref 30.0–36.0)
MCV: 96.7 fl (ref 78.0–100.0)
Platelets: 201 10*3/uL (ref 150.0–400.0)
RBC: 4.63 Mil/uL (ref 4.22–5.81)
RDW: 13.7 % (ref 11.5–15.5)
WBC: 5.5 10*3/uL (ref 4.0–10.5)

## 2021-09-09 LAB — LIPID PANEL
Cholesterol: 188 mg/dL (ref 0–200)
HDL: 74.9 mg/dL (ref >39.00)
LDL Cholesterol: 87 mg/dL (ref 0–99)
NonHDL: 112.89
Total CHOL/HDL Ratio: 3
Triglycerides: 128 mg/dL (ref 0.0–149.0)
VLDL: 25.6 mg/dL (ref 0.0–40.0)

## 2021-09-09 LAB — COMPREHENSIVE METABOLIC PANEL
ALT: 24 U/L (ref 0–53)
AST: 23 U/L (ref 0–37)
Albumin: 4.3 g/dL (ref 3.5–5.2)
Alkaline Phosphatase: 50 U/L (ref 39–117)
BUN: 15 mg/dL (ref 6–23)
CO2: 25 mEq/L (ref 19–32)
Calcium: 9.5 mg/dL (ref 8.4–10.5)
Chloride: 103 mEq/L (ref 96–112)
Creatinine, Ser: 1.01 mg/dL (ref 0.40–1.50)
GFR: 80.4 mL/min (ref >60.00)
Glucose, Bld: 124 mg/dL — ABNORMAL HIGH (ref 70–99)
Potassium: 4.5 mEq/L (ref 3.5–5.1)
Sodium: 139 mEq/L (ref 135–145)
Total Bilirubin: 0.6 mg/dL (ref 0.2–1.2)
Total Protein: 7.1 g/dL (ref 6.0–8.3)

## 2021-09-09 LAB — MICROALBUMIN / CREATININE URINE RATIO
Creatinine,U: 113.7 mg/dL
Microalb Creat Ratio: 0.7 mg/g (ref 0.0–30.0)
Microalb, Ur: 0.8 mg/dL (ref 0.0–1.9)

## 2021-09-09 LAB — HEMOGLOBIN A1C: Hgb A1c MFr Bld: 7.1 % — ABNORMAL HIGH (ref 4.6–6.5)

## 2021-09-09 LAB — URIC ACID: Uric Acid, Serum: 9.5 mg/dL — ABNORMAL HIGH (ref 4.0–7.8)

## 2021-09-09 NOTE — Telephone Encounter (Signed)
Pt had blood work done this morning.  ?

## 2021-09-13 ENCOUNTER — Ambulatory Visit (INDEPENDENT_AMBULATORY_CARE_PROVIDER_SITE_OTHER): Payer: 59 | Admitting: Internal Medicine

## 2021-09-13 ENCOUNTER — Other Ambulatory Visit: Payer: Self-pay

## 2021-09-13 ENCOUNTER — Encounter: Payer: Self-pay | Admitting: Internal Medicine

## 2021-09-13 VITALS — BP 132/76 | HR 70 | Temp 98.2°F | Ht 65.29 in | Wt 217.4 lb

## 2021-09-13 DIAGNOSIS — Z0000 Encounter for general adult medical examination without abnormal findings: Secondary | ICD-10-CM | POA: Diagnosis not present

## 2021-09-13 DIAGNOSIS — Z91018 Allergy to other foods: Secondary | ICD-10-CM

## 2021-09-13 DIAGNOSIS — E1169 Type 2 diabetes mellitus with other specified complication: Secondary | ICD-10-CM

## 2021-09-13 DIAGNOSIS — Z789 Other specified health status: Secondary | ICD-10-CM | POA: Diagnosis not present

## 2021-09-13 DIAGNOSIS — F411 Generalized anxiety disorder: Secondary | ICD-10-CM

## 2021-09-13 DIAGNOSIS — M1A0790 Idiopathic chronic gout, unspecified ankle and foot, without tophus (tophi): Secondary | ICD-10-CM | POA: Diagnosis not present

## 2021-09-13 DIAGNOSIS — E785 Hyperlipidemia, unspecified: Secondary | ICD-10-CM

## 2021-09-13 LAB — PSA: PSA: 0.55 ng/mL (ref 0.10–4.00)

## 2021-09-13 MED ORDER — COLCHICINE 0.6 MG PO TABS: 0.6000 mg | ORAL_TABLET | Freq: Every day | ORAL | 0 refills | Status: DC

## 2021-09-13 MED ORDER — ALLOPURINOL 300 MG PO TABS: 300.0000 mg | ORAL_TABLET | Freq: Every day | ORAL | 3 refills | Status: DC

## 2021-09-13 MED ORDER — TAMSULOSIN HCL 0.4 MG PO CAPS: 0.4000 mg | ORAL_CAPSULE | Freq: Every day | ORAL | 3 refills | Status: DC

## 2021-09-13 MED ORDER — PREDNISONE 20 MG PO TABS: 40.0000 mg | ORAL_TABLET | Freq: Every day | ORAL | 0 refills | Status: DC

## 2021-09-13 NOTE — Progress Notes (Signed)
? ?  Subjective:  ? ?Patient ID: Jeffrey Dougherty, male    DOB: [birth year], 61 y.o.   MRN: [id number] ? ?HPI ?The patient is here for physical. With concerns. ? ?PMH, [facility], social history reviewed and updated ? ?Review of Systems  ?Constitutional: Negative.   ?HENT: Negative.    ?Eyes: Negative.   ?Respiratory:  Negative for cough, chest tightness and shortness of breath.   ?Cardiovascular:  Negative for chest pain, palpitations and leg swelling.  ?Gastrointestinal:  Positive for abdominal pain. Negative for abdominal distention, constipation, diarrhea, nausea and vomiting.  ?Genitourinary:  Positive for enuresis and frequency.  ?Musculoskeletal:  Positive for arthralgias, joint swelling and myalgias.  ?Skin: Negative.   ?Neurological: Negative.   ?Psychiatric/Behavioral: Negative.    ? ?Objective:  ?Physical Exam ?Constitutional:   ?   Appearance: He is well-developed.  ?HENT:  ?   Head: Normocephalic and atraumatic.  ?Cardiovascular:  ?   Rate and Rhythm: Normal rate and regular rhythm.  ?Pulmonary:  ?   Effort: Pulmonary effort is normal. No respiratory distress.  ?   Breath sounds: Normal breath sounds. No wheezing or rales.  ?Abdominal:  ?   General: Bowel sounds are normal. There is no distension.  ?   Palpations: Abdomen is soft.  ?   Tenderness: There is abdominal tenderness. There is no rebound.  ?   Comments: Mild tenderness diffuse  ?Musculoskeletal:  ?   Cervical back: Normal range of motion.  ?Skin: ?   General: Skin is warm and dry.  ?Neurological:  ?   Mental Status: He is alert and oriented to person, place, and time.  ?   Coordination: Coordination normal.  ? ? ?Vitals:  ? 09/13/21 1258  ?BP: 132/76  ?Pulse: 70  ?Temp: 98.2 ?F (36.8 ?C)  ?TempSrc: Oral  ?SpO2: 96%  ?Weight: 217 lb 6.4 oz (98.6 kg)  ?Height: 5' 5.29" (1.658 m)  ? ? ?This visit occurred during the SARS-CoV-2 public health emergency.  Safety protocols were in place, including screening questions prior to the visit, additional usage  of staff PPE, and extensive cleaning of exam room while observing appropriate contact time as indicated for disinfecting solutions.  ? ?Assessment & Plan:  ? ?

## 2021-09-13 NOTE — Patient Instructions (Addendum)
We have sent in allopurinol to take 1 pill daily to lower the uric acid level. We have also sent in colchicine to take 1 pill daily for the first 3-4 weeks of taking allopurinol as you are slightly at risk for gout flare up during the first weeks of allopurinol. ? ?We have sent in prednisone to use for a gout flare up in case needed. ? ?We have sent in flomax to take 1 pill daily in the evening to help with the prostate.  ? ?Try a probiotic for the gut. ?

## 2021-09-16 DIAGNOSIS — Z789 Other specified health status: Secondary | ICD-10-CM | POA: Insufficient documentation

## 2021-09-16 NOTE — Assessment & Plan Note (Signed)
Takes rare alprazolam last filled 1 year ago. He will let us know if/when he needs refill.  ?

## 2021-09-16 NOTE — Assessment & Plan Note (Signed)
Taking crestor 10 mg daily. Recent lipid panel reviewed with him. We will continue crestor 10 mg daily.  ?

## 2021-09-16 NOTE — Assessment & Plan Note (Signed)
Still abstaining from all red meats.  ?

## 2021-09-16 NOTE — Assessment & Plan Note (Signed)
BMI 17.3 complicated by diabetes, gout. Counseled to reduce alcohol intake to help with weight.  ?

## 2021-09-16 NOTE — Assessment & Plan Note (Signed)
Reviewed lab results with him and controlled. Does not need medication for sugar currently. Is on statin. Does not need ACE-I/ARB currently. Foot exam done and reminded about need for yearly eye exam. Likely his alcohol usage is contributing to his sugar levels.  ?

## 2021-09-16 NOTE — Assessment & Plan Note (Signed)
Currently consuming about 5-6 beers/drinks per day. This is above safe limits of 14 drinks per week for men. He is unable to cut back at this time. This is causing gout symptoms.  ?

## 2021-09-16 NOTE — Assessment & Plan Note (Signed)
Labs done prior to physical with elevated uric acid. He is having flares of gout. Rx allopurinol 300 mg daily and advised to take colchicine 0.6 mg daily for first month then continue on allopurinol 300 mg daily alone. We will recheck uric acid in 1-2 months. Rx prednisone to have on hand in case of flare. Triggered by alcohol and he is not able to cut back currently.  ?

## 2021-09-16 NOTE — Assessment & Plan Note (Signed)
Flu shot up to date. Covid-19 counseled. Shingrix complete. Tetanus up to date. Colonoscopy up to date. Counseled about sun safety and mole surveillance. Counseled about the dangers of distracted driving. Given 10 year screening recommendations.   

## 2021-10-06 ENCOUNTER — Other Ambulatory Visit: Payer: Self-pay | Admitting: Internal Medicine

## 2021-11-13 ENCOUNTER — Other Ambulatory Visit: Payer: Self-pay | Admitting: Internal Medicine

## 2021-11-28 ENCOUNTER — Other Ambulatory Visit: Payer: Self-pay | Admitting: Internal Medicine

## 2021-12-08 ENCOUNTER — Telehealth: Payer: Self-pay | Admitting: Internal Medicine

## 2021-12-08 NOTE — Telephone Encounter (Signed)
Patient is taking rosuvastatin and is experiencing joint pains, muscle pains, and some dizziness - he would like to know if this could be coming from the medication.

## 2021-12-09 NOTE — Telephone Encounter (Signed)
This is not common with this medication. I would recommend to decrease to every other day and let us know in 1-2 weeks how he is feeling or visit sooner to address

## 2021-12-13 NOTE — Telephone Encounter (Signed)
Unable to get in contact with the patient due his phone going straight to voicemail. Office number was provided

## 2021-12-16 IMAGING — CT CT ABD-PELV W/ CM
2 of 5 series · 16 of 46 positions shown, 18 images · IV contrast (APPLIED)
Comparison: None.

CLINICAL DATA: 58-year-old male with left lower quadrant abdominal
pain. Concern for acute diverticulitis.

EXAM:
CT ABDOMEN AND PELVIS WITH CONTRAST
TECHNIQUE: Multidetector CT imaging of the abdomen and pelvis was performed
using the standard protocol following bolus administration of
intravenous contrast.
CONTRAST:  100mL OMNIPAQUE IOHEXOL 300 MG/ML  SOLN

[Series 3: abdomen 5.0 · axial · 0.78mm/px · z∈[-453,-28]mm · 13 of 99 slices shown, 15 images]
[im 7/99  soft-tissue]
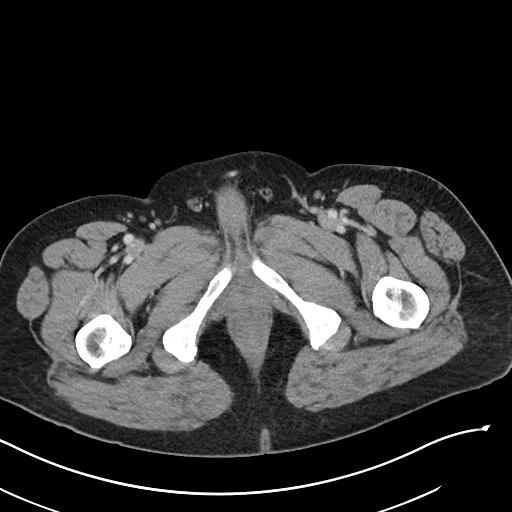
[im 7/99  bone]
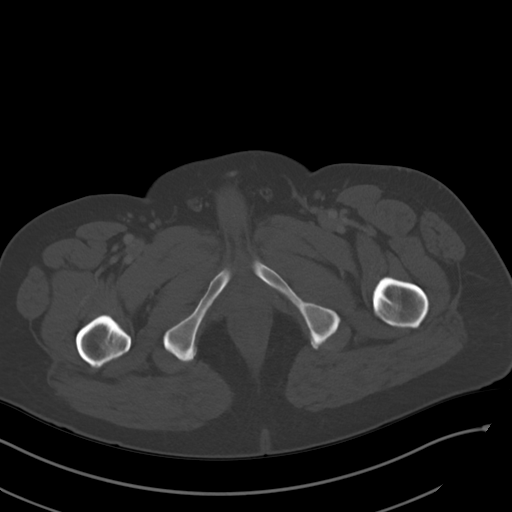
[im 14/99  soft-tissue]
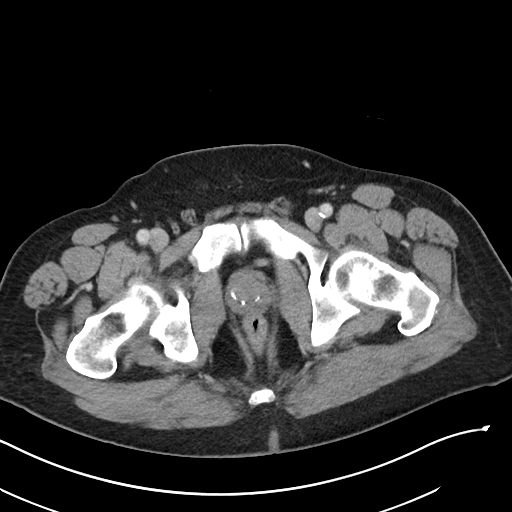
[im 20/99  soft-tissue]
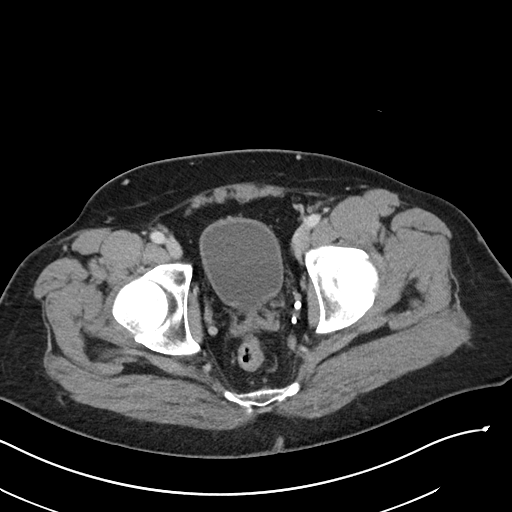
[im 27/99  soft-tissue]
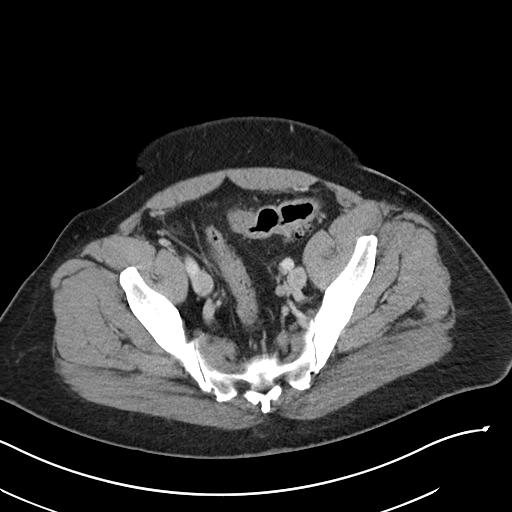
[im 33/99  soft-tissue]
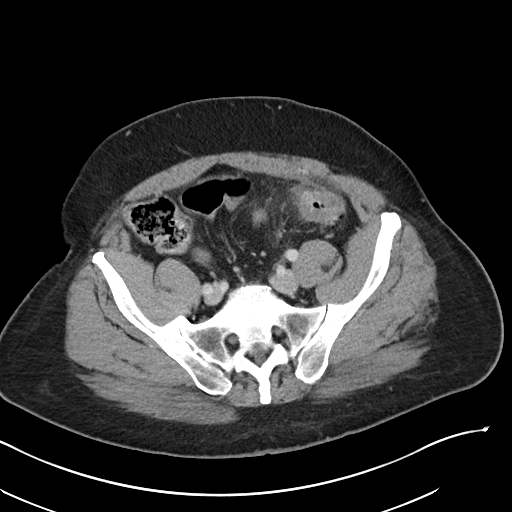
[im 40/99  soft-tissue]
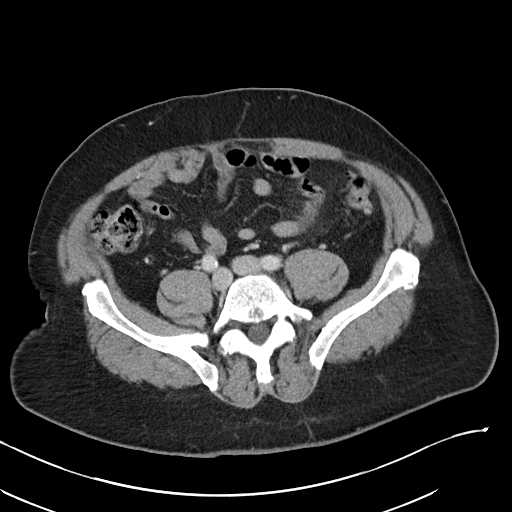
[im 53/99  soft-tissue]
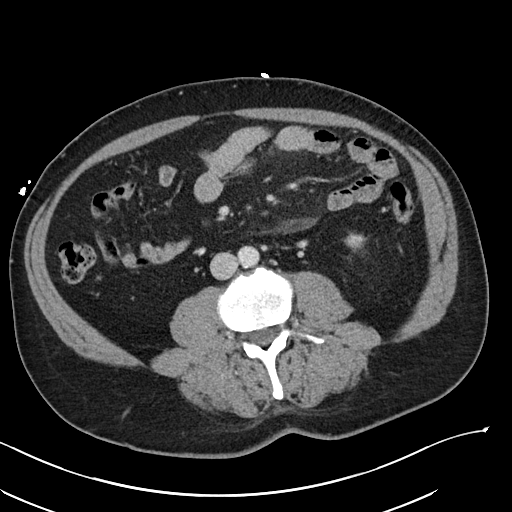
[im 59/99  soft-tissue]
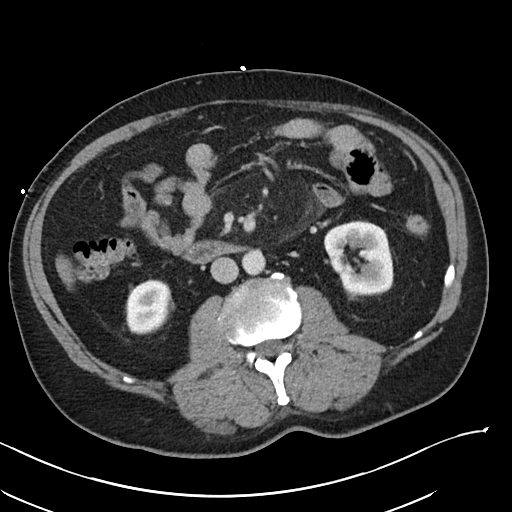
[im 66/99  soft-tissue]
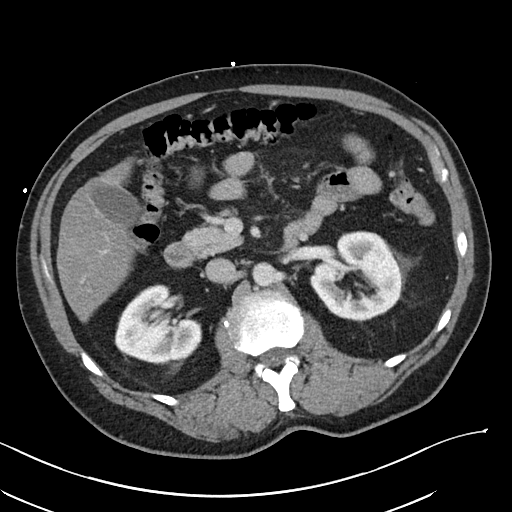
[im 66/99  bone]
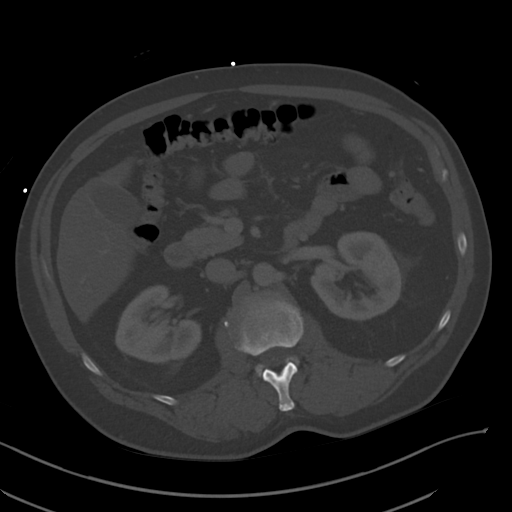
[im 72/99  soft-tissue]
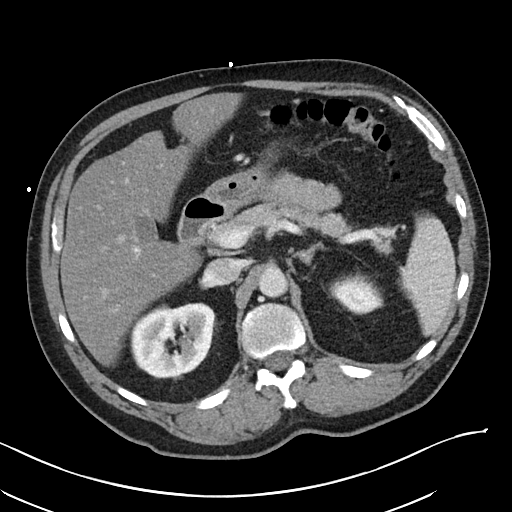
[im 79/99  soft-tissue]
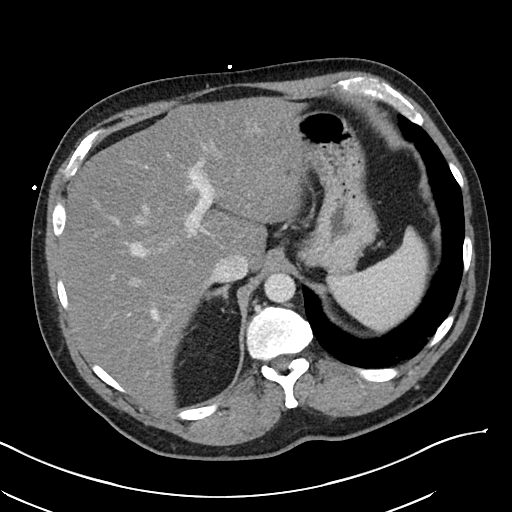
[im 85/99  soft-tissue]
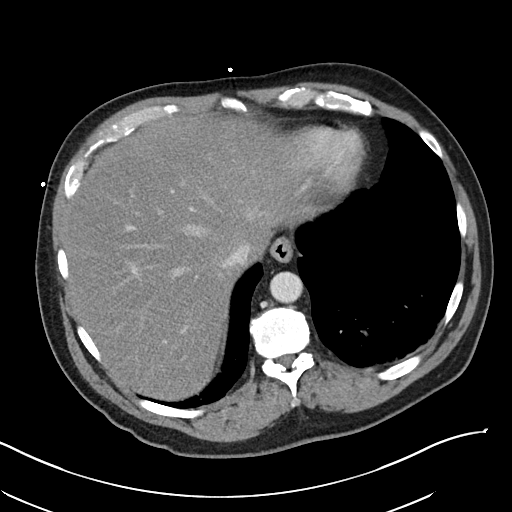
[im 92/99  soft-tissue]
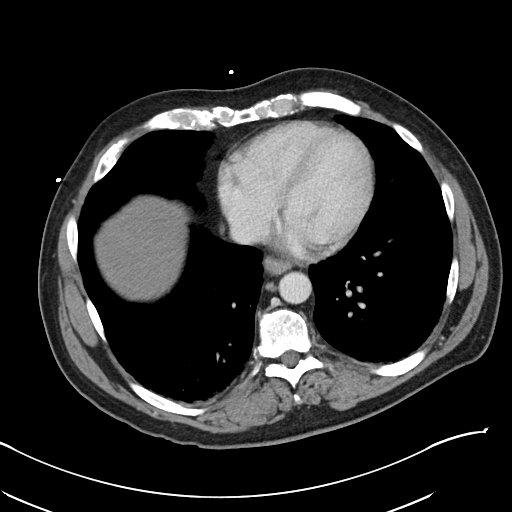

[Series 6: abdomen 3.0 mpr cor · coronal · 0.85mm/px · 3 of 104 slices shown]
[im 35/104  soft-tissue]
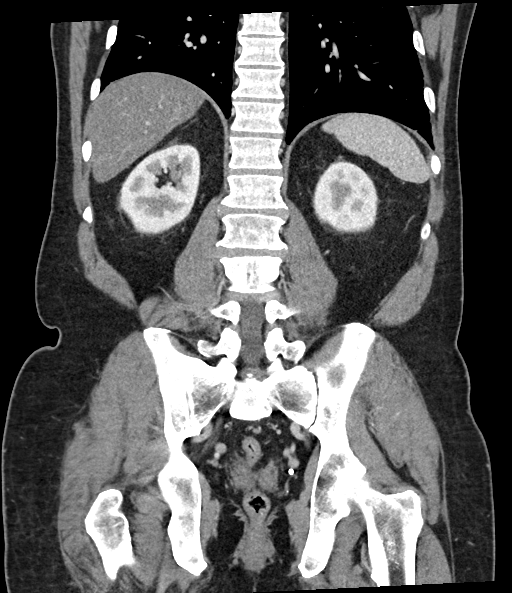
[im 46/104  soft-tissue]
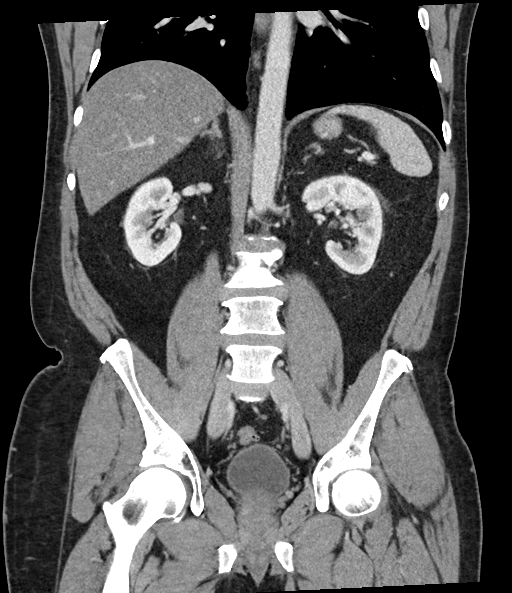
[im 58/104  soft-tissue]
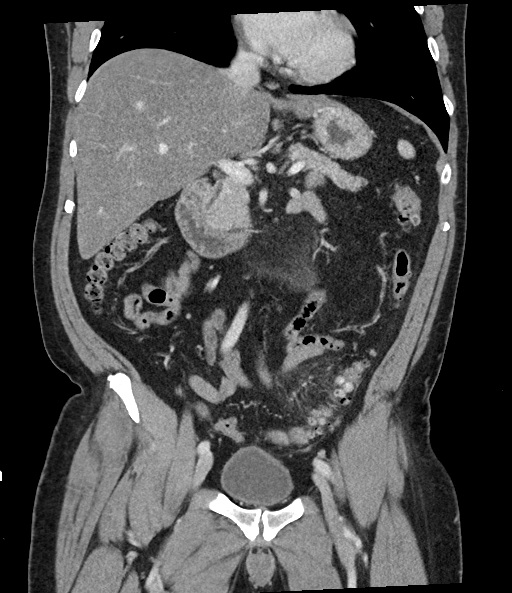

[16 of 46 positions shown; findings below may reference images not displayed]

FINDINGS: Lower chest: Minimal bibasilar dependent atelectasis. The visualized
lung bases are otherwise clear. There is coronary vascular
calcification.

No intra-abdominal free air or free fluid.

Hepatobiliary: There is fatty infiltration of the liver. No
intrahepatic biliary dilatation. The gallbladder is unremarkable.

Pancreas: Unremarkable. No pancreatic ductal dilatation or
surrounding inflammatory changes.

Spleen: Normal in size without focal abnormality.

Adrenals/Urinary Tract: The adrenal glands unremarkable. There is no
hydronephrosis on either side. There is symmetric enhancement and
excretion of contrast by both kidneys. The visualized ureters and
urinary bladder appear unremarkable.

Stomach/Bowel: There is sigmoid diverticulosis with active
inflammatory changes consistent with acute diverticulitis. No
diverticular abscess or perforation. There is scattered colonic
diverticula. There is no bowel obstruction. Appendectomy.

Vascular/Lymphatic: Mild aortoiliac atherosclerotic disease. The IVC
is unremarkable. No portal venous gas. There is no adenopathy. Upper
abdominal mesenteric stranding.

Reproductive: The prostate and seminal vesicles are grossly
unremarkable. No pelvic mass.

Other: None

Musculoskeletal: No acute or significant osseous findings.
IMPRESSION: 1. Sigmoid diverticulitis. No diverticular abscess or perforation.
2. Fatty liver.
3. Aortic Atherosclerosis (V56W5-KGC.C).

## 2022-02-09 ENCOUNTER — Other Ambulatory Visit: Payer: Self-pay | Admitting: Internal Medicine

## 2022-03-21 ENCOUNTER — Encounter: Payer: Self-pay | Admitting: Allergy

## 2022-03-21 ENCOUNTER — Ambulatory Visit: Payer: 59 | Admitting: Allergy

## 2022-03-21 VITALS — BP 112/66 | HR 70 | Temp 98.1°F | Resp 18 | Ht 69.5 in | Wt 219.0 lb

## 2022-03-21 DIAGNOSIS — T7840XD Allergy, unspecified, subsequent encounter: Secondary | ICD-10-CM | POA: Diagnosis not present

## 2022-03-21 DIAGNOSIS — Z91018 Allergy to other foods: Secondary | ICD-10-CM

## 2022-03-21 NOTE — Patient Instructions (Signed)
Alpha gal allergy Continue to avoid red meat (all mammalian meat) Avoid future tick bites.  Get bloodwork And if it looks favorable will do an in office food challenge next.  We are ordering labs, so please allow 1-2 weeks for the results to come back. With the newly implemented Cures Act, the labs might be visible to you at the same time that they become visible to me. However, I will not address the results until all of the results are back, so please be patient.  In the meantime, continue recommendations in your patient instructions, including avoidance measures (if applicable), until you hear from me. For mild symptoms you can take over the counter antihistamines such as Benadryl and monitor symptoms closely. If symptoms worsen or if you have severe symptoms including breathing issues, throat closure, significant swelling, whole body hives, severe diarrhea and vomiting, lightheadedness then inject epinephrine and seek immediate medical care afterwards.  Follow up in 1 year.

## 2022-03-21 NOTE — Assessment & Plan Note (Signed)
Allergic reaction in 2019 after eating hamburger in the form of rash, hypotension, diarrhea, dizziness - treated in the ER. 2019 alpha gal IgE 57.40. No additional episodes since avoiding red meat but would like to reintroduce. Owns a hunting business. . Continue to avoid red meat (all mammalian meat). . Avoid future tick bites.  . Get bloodwork. o And if it looks favorable will do an in office food challenge next.  . For mild symptoms you can take over the counter antihistamines such as Benadryl and monitor symptoms closely. If symptoms worsen or if you have severe symptoms including breathing issues, throat closure, significant swelling, whole body hives, severe diarrhea and vomiting, lightheadedness then inject epinephrine and seek immediate medical care afterwards.

## 2022-03-21 NOTE — Progress Notes (Signed)
New Patient Note  RE: Jeffrey Dougherty MRN: [id number] DOB: 1960/07/21 Date of Office Visit: 03/21/2022  Consult requested by: Hoyt Koch, * Primary care provider: Hoyt Koch, MD  Chief Complaint: Food Intolerance (Alpha gal)  History of Present Illness: I had the pleasure of seeing Jeffrey Dougherty for initial evaluation at the [facility] [place] [facility] [place] on 03/21/2022. He is a 61 y.o. male, who is self-referred here for the evaluation of alpha gal allergy.  Last seen in our office on 07/02/2018.   He reports food allergy to red meat. The reaction occurred a few years ago, after he ate a hamburger for lunch. Symptoms started within unknown amount of time and was in the form of rash, dizziness, lightheaded, hypotension, diarrhea. He was taken to the ER by his coworker as he was traveling for work. He was given IV fluids and not sure what else. Symptoms improved after a few hours. He was told initially it was his anxiety?  Past work up includes: 2019 bloodwork was positive to alpha gal. Dietary History: patient has been eating other foods including milk, eggs, peanut, treenuts, sesame, shellfish, fish, soy, wheat, meats, fruits and vegetables.  He reports reading labels and avoiding red meat in diet completely.  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   Component     Latest Ref Rng 07/02/2018  Beef (Bos spp) IgE     <0.35 kU/L 9.99 (H)   Class Interpretation 3   Lamb/Mutton (Ovis spp) IgE     <0.35 kU/L 0.85 (H)   Class Interpretation 2   Pork (Sus spp) IgE     <0.35 kU/L 4.23 (H)   Class Interpretation 3   Alpha Gal IgE*     <0.10 kU/L 57.40 (H)     Assessment and Plan: Jeffrey Dougherty is a 61 y.o. male with: Allergy to alpha-gal Allergic reaction in 2019 after eating hamburger in the form of rash, hypotension, diarrhea, dizziness - treated in the ER. 2019 alpha gal IgE 57.40. No additional episodes since avoiding red  meat but would like to reintroduce. Owns a hunting business. Continue to avoid red meat (all mammalian meat). Avoid future tick bites.  Get bloodwork. And if it looks favorable will do an in office food challenge next.  For mild symptoms you can take over the counter antihistamines such as Benadryl and monitor symptoms closely. If symptoms worsen or if you have severe symptoms including breathing issues, throat closure, significant swelling, whole body hives, severe diarrhea and vomiting, lightheadedness then inject epinephrine and seek immediate medical care afterwards.  Return if symptoms worsen or fail to improve, for Food challenge.  No orders of the defined types were placed in this encounter.  Lab Orders         Alpha-Gal Panel      Other allergy screening: Asthma: no Rhino conjunctivitis: no Medication allergy:  Cipro causes anxiety Hymenoptera allergy: no Urticaria: no Eczema:no History of recurrent infections suggestive of immunodeficency: no  Diagnostics: None.   Past Medical History: Patient Active Problem List   Diagnosis Date Noted   Alcohol ingestion 09/16/2021   Allergy to alpha-gal 04/18/2018   Type 2 diabetes mellitus with other specified complication ([place]) 08/67/6195   Routine health maintenance 03/31/2012   Gout 07/25/2010   Morbid obesity ([place]) 07/25/2010   Anxiety state 02/09/2009   Hyperlipidemia associated with type 2 diabetes mellitus ([place]) 07/22/2007   Past Medical History:  Diagnosis Date  Anal or rectal pain 01/2009   Anxiety state, unspecified    Arthritis    Chronic kidney disease    kidney stones   External otitis    History of chickenpox    Hyperlipidemia    Nonspecific elevation of levels of transaminase or lactic acid dehydrogenase (LDH)    Pneumonia    Pneumonia, organism unspecified(486)    Preglaucoma, unspecified    Prostatitis    Temporomandibular joint disorders, unspecified    Past Surgical History: Past Surgical  History:  Procedure Laterality Date   APPENDECTOMY     COLONOSCOPY     POLYPECTOMY     TONSILLECTOMY     tracheotomy     VASECTOMY     Medication List:  Current Outpatient Medications  Medication Sig Dispense Refill   allopurinol (ZYLOPRIM) 300 MG tablet Take 1 tablet (300 mg total) by mouth daily. 90 tablet 3   ALPRAZolam (XANAX) 0.5 MG tablet TAKE 1 TABLET BY MOUTH DAILY AS NEEDED. 30 tablet 0   rosuvastatin (CRESTOR) 10 MG tablet TAKE 1 TABLET (10 MG TOTAL) BY MOUTH DAILY. PLEASE CALL OUR OFFICE TO SCHEDULE A FOLLOW UP 90 tablet 2   tamsulosin (FLOMAX) 0.4 MG CAPS capsule Take 0.4 mg by mouth daily.     EPINEPHrine 0.3 mg/0.3 mL IJ SOAJ injection Inject 0.3 mg into the muscle as needed for anaphylaxis.  (Patient not taking: Reported on 09/13/2021)     No current facility-administered medications for this visit.   Allergies: Allergies  Allergen Reactions   Ciprofloxacin     anxiety   Social History: Social History   Socioeconomic History   Marital status: Married    Spouse name: Not on file   Number of children: Not on file   Years of education: 16   Highest education level: Not on file  Occupational History   Occupation: PARTS DIRECTOR    Employer: [employer]  Tobacco Use   Smoking status: Former    Packs/day: 0.50    Types: Cigarettes   Smokeless tobacco: Never  Vaping Use   Vaping Use: Never used  Substance and Sexual Activity   Alcohol use: Yes    Alcohol/week: 48.0 standard drinks of alcohol    Types: 48 Cans of beer per week   Drug use: No   Sexual activity: Yes    Partners: Female  Other Topics Concern   Not on file  Social History Narrative   HSG. Married-'87.  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 . Marriage in good health.         Social Determinants of Health   Financial Resource Strain: Not on file  Food Insecurity: Not on file  Transportation Needs: Not on  file  Physical Activity: Not on file  Stress: Not on file  Social Connections: Not on file   Lives in a 62 year old house. Smoking: quit Occupation: retired  Programme researcher, broadcasting/film/video History: Environmental education officer in the house: no Charity fundraiser in the family room: no Carpet in the bedroom: no Heating:  electric and gas Cooling: central Pet: yes 1 dog  Family History Family History  Problem Relation Age of Onset   Alcohol abuse Mother    Hip fracture Mother    Cancer Mother        breast   Breast cancer Mother    Allergic rhinitis Mother    Cancer Father        Prostate cancer   Arthritis  Father        rheumatoid   Colon polyps Father    Prostate cancer Father    Pancreatic cancer Brother    Colon polyps Brother    Coronary artery disease Neg Hx    Stroke Neg Hx    Colon cancer Neg Hx    Esophageal cancer Neg Hx    Stomach cancer Neg Hx    Rectal cancer Neg Hx    Review of Systems  Constitutional:  Negative for appetite change, chills, fever and unexpected weight change.  HENT:  Negative for congestion and rhinorrhea.   Eyes:  Negative for itching.  Respiratory:  Negative for cough, chest tightness, shortness of breath and wheezing.   Cardiovascular:  Negative for chest pain.  Gastrointestinal:  Negative for abdominal pain.  Genitourinary:  Negative for difficulty urinating.  Skin:  Negative for rash.  Allergic/Immunologic: Positive for food allergies.  Neurological:  Negative for headaches.    Objective: BP 112/66   Pulse 70   Temp 98.1 F (36.7 C) (Temporal)   Resp 18   Ht 5' 9.5" (1.765 m)   Wt 219 lb (99.3 kg)   SpO2 96%   BMI 31.88 kg/m  Body mass index is 31.88 kg/m. Physical Exam Vitals and nursing note reviewed.  Constitutional:      Appearance: Normal appearance. He is well-developed.  HENT:     Head: Normocephalic and atraumatic.     Right Ear: Tympanic membrane and external ear normal.     Left Ear: Tympanic membrane and external ear normal.     Nose:  Nose normal.     Mouth/Throat:     Mouth: Mucous membranes are moist.     Pharynx: Oropharynx is clear.  Eyes:     Conjunctiva/sclera: Conjunctivae normal.  Cardiovascular:     Rate and Rhythm: Normal rate and regular rhythm.     Heart sounds: Normal heart sounds. No murmur heard.    No friction rub. No gallop.  Pulmonary:     Effort: Pulmonary effort is normal.     Breath sounds: Normal breath sounds. No wheezing, rhonchi or rales.  Musculoskeletal:     Cervical back: Neck supple.  Skin:    General: Skin is warm.     Findings: No rash.  Neurological:     Mental Status: He is alert and oriented to person, place, and time.  Psychiatric:        Behavior: Behavior normal.    The plan was reviewed with the patient/family, and all questions/concerned were addressed.  It was my pleasure to see Jeffrey Dougherty today and participate in his care. Please feel free to contact me with any questions or concerns.  Sincerely,  Rexene Alberts, DO Allergy & Immunology  [facility] office: [place] office: [phone]

## 2022-03-24 LAB — ALPHA-GAL PANEL
Allergen Lamb IgE: 6.36 kU/L — AB
Beef IgE: 10.8 kU/L — AB
IgE (Immunoglobulin E), Serum: 158 IU/mL (ref 6–495)
O215-IgE Alpha-Gal: 26.7 kU/L — AB
Pork IgE: 6.95 kU/L — AB

## 2022-04-03 ENCOUNTER — Ambulatory Visit (INDEPENDENT_AMBULATORY_CARE_PROVIDER_SITE_OTHER): Payer: 59

## 2022-04-03 ENCOUNTER — Ambulatory Visit: Payer: 59 | Admitting: Podiatry

## 2022-04-03 DIAGNOSIS — M7731 Calcaneal spur, right foot: Secondary | ICD-10-CM

## 2022-04-03 DIAGNOSIS — M722 Plantar fascial fibromatosis: Secondary | ICD-10-CM

## 2022-04-03 MED ORDER — IBUPROFEN 800 MG PO TABS: 800.0000 mg | ORAL_TABLET | Freq: Three times a day (TID) | ORAL | 0 refills | Status: DC | PRN

## 2022-04-03 MED ORDER — METHYLPREDNISOLONE 4 MG PO TBPK: ORAL_TABLET | ORAL | 0 refills | Status: DC

## 2022-04-03 NOTE — Progress Notes (Signed)
Subjective:   Patient ID: Jeffrey Dougherty, male   DOB: 61 y.o.   MRN: [id number]   HPI Chief Complaint  Patient presents with   Plantar Fasciitis    Patient came in today with plantar fasciitis on the right foot, patient is having heel, started 3 weeks ago, Rate of pain 5 out of 10,  Morning pain and after sitting for long periods of time, X-Rays  done today, TX: pain meds, stretching, using walk-pro arch supports     61 y.o. male presents with the above concerns. No injuries when this started. He has been taking ibuprofen '800mg'$  in the mroning and '400mg'$  at lunch and occasional at night. He has already walked 2.5 miles today.   Has a hunting tip coming up.    Review of Systems  All other systems reviewed and are negative.  Past Medical History:  Diagnosis Date   Anal or rectal pain 01/2009   Anxiety state, unspecified    Arthritis    Chronic kidney disease    kidney stones   External otitis    History of chickenpox    Hyperlipidemia    Nonspecific elevation of levels of transaminase or lactic acid dehydrogenase (LDH)    Pneumonia    Pneumonia, organism unspecified(486)    Preglaucoma, unspecified    Prostatitis    Temporomandibular joint disorders, unspecified     Past Surgical History:  Procedure Laterality Date   APPENDECTOMY     COLONOSCOPY     POLYPECTOMY     TONSILLECTOMY     tracheotomy     VASECTOMY       Current Outpatient Medications:    ibuprofen (ADVIL) 800 MG tablet, Take 1 tablet (800 mg total) by mouth every 8 (eight) hours as needed., Disp: 30 tablet, Rfl: 0   methylPREDNISolone (MEDROL DOSEPAK) 4 MG TBPK tablet, Take as directed, Disp: 21 tablet, Rfl: 0   allopurinol (ZYLOPRIM) 300 MG tablet, Take 1 tablet (300 mg total) by mouth daily., Disp: 90 tablet, Rfl: 3   ALPRAZolam (XANAX) 0.5 MG tablet, TAKE 1 TABLET BY MOUTH DAILY AS NEEDED., Disp: 30 tablet, Rfl: 0   EPINEPHrine 0.3 mg/0.3 mL IJ SOAJ injection, Inject 0.3 mg into the muscle as needed  for anaphylaxis.  (Patient not taking: Reported on 09/13/2021), Disp: , Rfl:    rosuvastatin (CRESTOR) 10 MG tablet, TAKE 1 TABLET (10 MG TOTAL) BY MOUTH DAILY. PLEASE CALL OUR OFFICE TO SCHEDULE A FOLLOW UP, Disp: 90 tablet, Rfl: 2   tamsulosin (FLOMAX) 0.4 MG CAPS capsule, Take 0.4 mg by mouth daily., Disp: , Rfl:   Allergies  Allergen Reactions   Ciprofloxacin     anxiety          Objective:  Physical Exam  General: AAO x3, NAD  Dermatological: Skin is warm, dry and supple bilateral.  There are no open sores, no preulcerative lesions, no rash or signs of infection present.  Vascular: Dorsalis Pedis artery and Posterior Tibial artery pedal pulses are 2/4 bilateral with immedate capillary fill time.  There is no pain with calf compression, swelling, warmth, erythema.   Neruologic: Grossly intact via light touch bilateral.  Negative Tinel sign.  Musculoskeletal: Tenderness to palpation along the plantar tubercle of the calcaneus at the insertion of plantar fascia on the right foot. There is no pain along the course of the plantar fascia within the arch of the foot. Plantar fascia appears to be intact. There is no pain with lateral compression of the calcaneus  or pain with vibratory sensation. There is no pain along the course or insertion of the achilles tendon. No other areas of tenderness to bilateral lower extremities.   Gait: Unassisted, Nonantalgic.       Assessment:   Right heel pain, Plantar fasciitis     Plan:  -Treatment options discussed including all alternatives, risks, and complications -Etiology of symptoms were discussed -X-rays were obtained and reviewed with the patient.  3 views of the right foot were obtained.  No evidence of acute fracture.  Inferior calcaneal spurring is present. -Plantar fascia brace dispensed to help support the plantar fascia -Medrol Dosepak was prescribed and once complete can start anti-inflammatories.  States that he responds very  well typically the oral steroids. -Stretching, icing daily -Discussed shoe modifications and orthotics -If symptoms continue consider steroid injection  Trula Slade DPM

## 2022-04-03 NOTE — Patient Instructions (Signed)
Started with the medrol dose pack (steroid). Once complete then you can start he ibuprofen as needed. Do not take together  For instructions on how to put on your Plantar Fascial Brace, please visit [email or web address]  Plantar Fasciitis (Heel Spur Syndrome) with Rehab The plantar fascia is a fibrous, ligament-like, soft-tissue structure that spans the bottom of the foot. Plantar fasciitis is a condition that causes pain in the foot due to inflammation of the tissue. SYMPTOMS  Pain and tenderness on the underneath side of the foot. Pain that worsens with standing or walking. CAUSES  Plantar fasciitis is caused by irritation and injury to the plantar fascia on the underneath side of the foot. Common mechanisms of injury include: Direct trauma to bottom of the foot. Damage to a small nerve that runs under the foot where the main fascia attaches to the heel bone. Stress placed on the plantar fascia due to bone spurs. RISK INCREASES WITH:  Activities that place stress on the plantar fascia (running, jumping, pivoting, or cutting). Poor strength and flexibility. Improperly fitted shoes. Tight calf muscles. Flat feet. Failure to warm-up properly before activity. Obesity. PREVENTION Warm up and stretch properly before activity. Allow for adequate recovery between workouts. Maintain physical fitness: Strength, flexibility, and endurance. Cardiovascular fitness. Maintain a health body weight. Avoid stress on the plantar fascia. Wear properly fitted shoes, including arch supports for individuals who have flat feet.  PROGNOSIS  If treated properly, then the symptoms of plantar fasciitis usually resolve without surgery. However, occasionally surgery is necessary.  RELATED COMPLICATIONS  Recurrent symptoms that may result in a chronic condition. Problems of the lower back that are caused by compensating for the injury, such as limping. Pain or weakness of the foot during push-off  following surgery. Chronic inflammation, scarring, and partial or complete fascia tear, occurring more often from repeated injections.  TREATMENT  Treatment initially involves the use of ice and medication to help reduce pain and inflammation. The use of strengthening and stretching exercises may help reduce pain with activity, especially stretches of the Achilles tendon. These exercises may be performed at home or with a therapist. Your caregiver may recommend that you use heel cups of arch supports to help reduce stress on the plantar fascia. Occasionally, corticosteroid injections are given to reduce inflammation. If symptoms persist for greater than 6 months despite non-surgical (conservative), then surgery may be recommended.   MEDICATION  If pain medication is necessary, then nonsteroidal anti-inflammatory medications, such as aspirin and ibuprofen, or other minor pain relievers, such as acetaminophen, are often recommended. Do not take pain medication within 7 days before surgery. Prescription pain relievers may be given if deemed necessary by your caregiver. Use only as directed and only as much as you need. Corticosteroid injections may be given by your caregiver. These injections should be reserved for the most serious cases, because they may only be given a certain number of times.  HEAT AND COLD Cold treatment (icing) relieves pain and reduces inflammation. Cold treatment should be applied for 10 to 15 minutes every 2 to 3 hours for inflammation and pain and immediately after any activity that aggravates your symptoms. Use ice packs or massage the area with a piece of ice (ice massage). Heat treatment may be used prior to performing the stretching and strengthening activities prescribed by your caregiver, physical therapist, or athletic trainer. Use a heat pack or soak the injury in warm water.  SEEK IMMEDIATE MEDICAL CARE IF: Treatment seems to offer no benefit, or  the condition  worsens. Any medications produce adverse side effects.  EXERCISES- RANGE OF MOTION (ROM) AND STRETCHING EXERCISES - Plantar Fasciitis (Heel Spur Syndrome) These exercises may help you when beginning to rehabilitate your injury. Your symptoms may resolve with or without further involvement from your physician, physical therapist or athletic trainer. While completing these exercises, remember:  Restoring tissue flexibility helps normal motion to return to the joints. This allows healthier, less painful movement and activity. An effective stretch should be held for at least 30 seconds. A stretch should never be painful. You should only feel a gentle lengthening or release in the stretched tissue.  RANGE OF MOTION - Toe Extension, Flexion Sit with your right / left leg crossed over your opposite knee. Grasp your toes and gently pull them back toward the top of your foot. You should feel a stretch on the bottom of your toes and/or foot. Hold this stretch for 10 seconds. Now, gently pull your toes toward the bottom of your foot. You should feel a stretch on the top of your toes and or foot. Hold this stretch for 10 seconds. Repeat  times. Complete this stretch 3 times per day.   RANGE OF MOTION - Ankle Dorsiflexion, Active Assisted Remove shoes and sit on a chair that is preferably not on a carpeted surface. Place right / left foot under knee. Extend your opposite leg for support. Keeping your heel down, slide your right / left foot back toward the chair until you feel a stretch at your ankle or calf. If you do not feel a stretch, slide your bottom forward to the edge of the chair, while still keeping your heel down. Hold this stretch for 10 seconds. Repeat 3 times. Complete this stretch 2 times per day.   STRETCH  Gastroc, Standing Place hands on wall. Extend right / left leg, keeping the front knee somewhat bent. Slightly point your toes inward on your back foot. Keeping your right / left  heel on the floor and your knee straight, shift your weight toward the wall, not allowing your back to arch. You should feel a gentle stretch in the right / left calf. Hold this position for 10 seconds. Repeat 3 times. Complete this stretch 2 times per day.  STRETCH  Soleus, Standing Place hands on wall. Extend right / left leg, keeping the other knee somewhat bent. Slightly point your toes inward on your back foot. Keep your right / left heel on the floor, bend your back knee, and slightly shift your weight over the back leg so that you feel a gentle stretch deep in your back calf. Hold this position for 10 seconds. Repeat 3 times. Complete this stretch 2 times per day.  STRETCH  Gastrocsoleus, Standing  Note: This exercise can place a lot of stress on your foot and ankle. Please complete this exercise only if specifically instructed by your caregiver.  Place the ball of your right / left foot on a step, keeping your other foot firmly on the same step. Hold on to the wall or a rail for balance. Slowly lift your other foot, allowing your body weight to press your heel down over the edge of the step. You should feel a stretch in your right / left calf. Hold this position for 10 seconds. Repeat this exercise with a slight bend in your right / left knee. Repeat 3 times. Complete this stretch 2 times per day.   STRENGTHENING EXERCISES - Plantar Fasciitis (Heel Spur Syndrome)  These exercises may help you when beginning to rehabilitate your injury. They may resolve your symptoms with or without further involvement from your physician, physical therapist or athletic trainer. While completing these exercises, remember:  Muscles can gain both the endurance and the strength needed for everyday activities through controlled exercises. Complete these exercises as instructed by your physician, physical therapist or athletic trainer. Progress the resistance and repetitions only as guided.  STRENGTH -  Towel Curls Sit in a chair positioned on a non-carpeted surface. Place your foot on a towel, keeping your heel on the floor. Pull the towel toward your heel by only curling your toes. Keep your heel on the floor. Repeat 3 times. Complete this exercise 2 times per day.  STRENGTH - Ankle Inversion Secure one end of a rubber exercise band/tubing to a fixed object (table, pole). Loop the other end around your foot just before your toes. Place your fists between your knees. This will focus your strengthening at your ankle. Slowly, pull your big toe up and in, making sure the band/tubing is positioned to resist the entire motion. Hold this position for 10 seconds. Have your muscles resist the band/tubing as it slowly pulls your foot back to the starting position. Repeat 3 times. Complete this exercises 2 times per day.  Document Released: 06/19/2005 Document Revised: 09/11/2011 Document Reviewed: 10/01/2008 [employer] Patient Information 2014 [employer].]

## 2022-04-24 ENCOUNTER — Encounter: Payer: Self-pay | Admitting: Internal Medicine

## 2022-05-27 ENCOUNTER — Other Ambulatory Visit: Payer: Self-pay | Admitting: Podiatry

## 2022-05-27 ENCOUNTER — Other Ambulatory Visit: Payer: Self-pay | Admitting: Internal Medicine

## 2022-05-28 MED ORDER — IBUPROFEN 800 MG PO TABS: 800.0000 mg | ORAL_TABLET | Freq: Three times a day (TID) | ORAL | 0 refills | Status: DC | PRN

## 2022-05-29 MED ORDER — ALPRAZOLAM 0.5 MG PO TABS: 0.5000 mg | ORAL_TABLET | Freq: Every day | ORAL | 0 refills | Status: DC | PRN

## 2022-07-17 ENCOUNTER — Other Ambulatory Visit: Payer: Self-pay | Admitting: Internal Medicine

## 2022-07-17 DIAGNOSIS — M1A0790 Idiopathic chronic gout, unspecified ankle and foot, without tophus (tophi): Secondary | ICD-10-CM

## 2022-07-17 DIAGNOSIS — E1169 Type 2 diabetes mellitus with other specified complication: Secondary | ICD-10-CM

## 2022-07-17 DIAGNOSIS — Z125 Encounter for screening for malignant neoplasm of prostate: Secondary | ICD-10-CM

## 2022-08-30 ENCOUNTER — Other Ambulatory Visit: Payer: Self-pay | Admitting: Internal Medicine

## 2022-09-12 ENCOUNTER — Other Ambulatory Visit (INDEPENDENT_AMBULATORY_CARE_PROVIDER_SITE_OTHER): Payer: 59

## 2022-09-12 DIAGNOSIS — Z125 Encounter for screening for malignant neoplasm of prostate: Secondary | ICD-10-CM

## 2022-09-12 DIAGNOSIS — E1169 Type 2 diabetes mellitus with other specified complication: Secondary | ICD-10-CM | POA: Diagnosis not present

## 2022-09-12 DIAGNOSIS — M1A0790 Idiopathic chronic gout, unspecified ankle and foot, without tophus (tophi): Secondary | ICD-10-CM

## 2022-09-12 DIAGNOSIS — E785 Hyperlipidemia, unspecified: Secondary | ICD-10-CM | POA: Diagnosis not present

## 2022-09-12 LAB — PSA: PSA: 0.76 ng/mL (ref 0.10–4.00)

## 2022-09-12 LAB — CBC
HCT: 45.9 % (ref 39.0–52.0)
Hemoglobin: 15.6 g/dL (ref 13.0–17.0)
MCHC: 34.1 g/dL (ref 30.0–36.0)
MCV: 95.9 fl (ref 78.0–100.0)
Platelets: 208 10*3/uL (ref 150.0–400.0)
RBC: 4.78 Mil/uL (ref 4.22–5.81)
RDW: 13.1 % (ref 11.5–15.5)
WBC: 5.4 10*3/uL (ref 4.0–10.5)

## 2022-09-12 LAB — COMPREHENSIVE METABOLIC PANEL
ALT: 19 U/L (ref 0–53)
AST: 21 U/L (ref 0–37)
Albumin: 4 g/dL (ref 3.5–5.2)
Alkaline Phosphatase: 52 U/L (ref 39–117)
BUN: 16 mg/dL (ref 6–23)
CO2: 25 mEq/L (ref 19–32)
Calcium: 9.9 mg/dL (ref 8.4–10.5)
Chloride: 102 mEq/L (ref 96–112)
Creatinine, Ser: 0.99 mg/dL (ref 0.40–1.50)
GFR: 81.77 mL/min (ref >60.00)
Glucose, Bld: 152 mg/dL — ABNORMAL HIGH (ref 70–99)
Potassium: 4.8 mEq/L (ref 3.5–5.1)
Sodium: 138 mEq/L (ref 135–145)
Total Bilirubin: 0.5 mg/dL (ref 0.2–1.2)
Total Protein: 6.6 g/dL (ref 6.0–8.3)

## 2022-09-12 LAB — LIPID PANEL
Cholesterol: 253 mg/dL — ABNORMAL HIGH (ref 0–200)
HDL: 59.2 mg/dL (ref >39.00)
LDL Cholesterol: 155 mg/dL — ABNORMAL HIGH (ref 0–99)
NonHDL: 193.64
Total CHOL/HDL Ratio: 4
Triglycerides: 195 mg/dL — ABNORMAL HIGH (ref 0.0–149.0)
VLDL: 39 mg/dL (ref 0.0–40.0)

## 2022-09-12 LAB — MICROALBUMIN / CREATININE URINE RATIO
Creatinine,U: 126.4 mg/dL
Microalb Creat Ratio: 0.6 mg/g (ref 0.0–30.0)
Microalb, Ur: 0.8 mg/dL (ref 0.0–1.9)

## 2022-09-12 LAB — HEMOGLOBIN A1C: Hgb A1c MFr Bld: 7.3 % — ABNORMAL HIGH (ref 4.6–6.5)

## 2022-09-12 LAB — URIC ACID: Uric Acid, Serum: 7.3 mg/dL (ref 4.0–7.8)

## 2022-09-18 ENCOUNTER — Encounter: Payer: Self-pay | Admitting: Internal Medicine

## 2022-09-18 ENCOUNTER — Ambulatory Visit (INDEPENDENT_AMBULATORY_CARE_PROVIDER_SITE_OTHER): Payer: 59 | Admitting: Internal Medicine

## 2022-09-18 VITALS — BP 120/80 | HR 64 | Temp 97.8°F | Ht 69.5 in | Wt 220.0 lb

## 2022-09-18 DIAGNOSIS — Z23 Encounter for immunization: Secondary | ICD-10-CM | POA: Diagnosis not present

## 2022-09-18 DIAGNOSIS — Z0000 Encounter for general adult medical examination without abnormal findings: Secondary | ICD-10-CM

## 2022-09-18 DIAGNOSIS — Z86010 Personal history of colonic polyps: Secondary | ICD-10-CM | POA: Diagnosis not present

## 2022-09-18 DIAGNOSIS — E1169 Type 2 diabetes mellitus with other specified complication: Secondary | ICD-10-CM

## 2022-09-18 DIAGNOSIS — E785 Hyperlipidemia, unspecified: Secondary | ICD-10-CM

## 2022-09-18 DIAGNOSIS — M1A0790 Idiopathic chronic gout, unspecified ankle and foot, without tophus (tophi): Secondary | ICD-10-CM

## 2022-09-18 DIAGNOSIS — Z789 Other specified health status: Secondary | ICD-10-CM | POA: Diagnosis not present

## 2022-09-18 DIAGNOSIS — F411 Generalized anxiety disorder: Secondary | ICD-10-CM | POA: Diagnosis not present

## 2022-09-18 MED ORDER — ALPRAZOLAM 0.5 MG PO TABS: 0.5000 mg | ORAL_TABLET | Freq: Every day | ORAL | 0 refills | Status: DC | PRN

## 2022-09-18 NOTE — Progress Notes (Unsigned)
   Subjective:   Patient ID: Jeffrey Dougherty, male    DOB: 02/09/1961, 62 y.o.   MRN: [id number]  HPI The patient is here for physical.  PMH, [facility], social history reviewed and updated  Review of Systems  Objective:  Physical Exam  Vitals:   09/18/22 0857  BP: 120/80  Pulse: 64  Temp: 97.8 F (36.6 C)  TempSrc: Oral  SpO2: 97%  Weight: 220 lb (99.8 kg)  Height: 5' 9.5" (1.765 m)    Assessment & Plan:  Flu shot given at visit

## 2022-09-19 NOTE — Assessment & Plan Note (Addendum)
Recent HgA1c 7.3 and elevating over time. Suspect alcohol contributes to his sugar levels. Counseled him about this. He does not want to start medicine at this time. Cut back on alcohol and recheck HgA1c in 6 months. He is resuming statin. No recent eye exam and talked to him about the importance of this he will schedule.

## 2022-09-19 NOTE — Assessment & Plan Note (Signed)
Had stopped crestor 10 mg daily due to possible muscle aches. We discussed mechanism of metabolism and lack of evidence for this. He will resume and keep tabs on symptoms to see if they change. He was uncertain about this. Resume 10 mg crestor daily.

## 2022-09-19 NOTE — Assessment & Plan Note (Signed)
Flu shot given. Shingrix complete. Tetanus due declines today. Colonoscopy due this year referral done. Counseled about sun safety and mole surveillance. Counseled about the dangers of distracted driving. Given 10 year screening recommendations.

## 2022-09-19 NOTE — Assessment & Plan Note (Addendum)
Uses rare alprazolam. Family is concerned about this with some mood swings. Suspect alcohol is more related to mood swings and discussed with patient and asked him to cut back on alcohol as a primary treatment. He is not interested at this time in medication and we agree that cutting back on alcohol will be beneficial and see where he is at after that settles out. Refilled alprazolam 0.5 mg daily prn uses rarely.

## 2022-09-19 NOTE — Assessment & Plan Note (Signed)
No flare today. Recent uric acid slightly above goal and likely related to alcohol usage. He will try to cut back. Continue allopurinol 300 mg daily.

## 2022-09-19 NOTE — Assessment & Plan Note (Signed)
Slight increase from last year to 6-7 beers per day. Counseled that he would benefit in terms of diabetes and blood pressure and mood to cut back. He has some mood disorder with mood swings per family and some anxiety. Counseled that this can be related to alcohol usage.

## 2022-09-22 ENCOUNTER — Encounter: Payer: Self-pay | Admitting: Gastroenterology

## 2022-10-31 ENCOUNTER — Ambulatory Visit (AMBULATORY_SURGERY_CENTER): Payer: 59 | Admitting: *Deleted

## 2022-10-31 ENCOUNTER — Encounter: Payer: Self-pay | Admitting: Gastroenterology

## 2022-10-31 VITALS — Ht 69.5 in | Wt 212.0 lb

## 2022-10-31 DIAGNOSIS — Z86010 Personal history of colonic polyps: Secondary | ICD-10-CM

## 2022-10-31 MED ORDER — NA SULFATE-K SULFATE-MG SULF 17.5-3.13-1.6 GM/177ML PO SOLN: 1.0000 | Freq: Once | ORAL | 0 refills | Status: AC

## 2022-10-31 NOTE — Progress Notes (Signed)
Pt's name and DOB verified at the beginning of the pre-visit.  Pt denies any difficulty with ambulating,sitting, laying down or rolling side to side Gave both [facility] main # and MD on call # prior to instructions.  No egg or soy allergy known to patient  No issues known to pt with past sedation with any surgeries or procedures Pt denies having issues being intubated Pt has no issues moving head neck or swallowing No FH of Malignant Hyperthermia Pt is not on diet pills Pt is not on home 02  Pt is not on blood thinners  Pt denies issues with constipation  Pt is not on dialysis Pt denies any upcoming cardiac testing Pt encouraged to use to use Singlecare or Goodrx to reduce cost  Patient's chart reviewed by Cathlyn Parsons CNRA prior to pre-visit and patient appropriate for the [facility].  Pre-visit completed and red dot placed by patient's name on their procedure day (on provider's schedule).  . Visit by phone Pt states weight is 212 lb Instructed pt why it is important to and  to call if they have any changes in health or new medications. Directed them to the # given and on instructions.   Pt states they will.  Instructions reviewed with pt and pt states understanding. Instructed to review again prior to procedure. Pt states they will.  Instructions sent by mail with coupon and by my chart

## 2022-11-03 ENCOUNTER — Other Ambulatory Visit: Payer: Self-pay | Admitting: Internal Medicine

## 2022-11-07 ENCOUNTER — Other Ambulatory Visit: Payer: Self-pay | Admitting: Podiatry

## 2022-11-18 ENCOUNTER — Encounter: Payer: Self-pay | Admitting: Certified Registered Nurse Anesthetist

## 2022-11-21 ENCOUNTER — Encounter: Payer: 59 | Admitting: Gastroenterology

## 2022-11-21 ENCOUNTER — Ambulatory Visit (AMBULATORY_SURGERY_CENTER): Payer: 59 | Admitting: Gastroenterology

## 2022-11-21 ENCOUNTER — Encounter: Payer: Self-pay | Admitting: Gastroenterology

## 2022-11-21 VITALS — BP 109/68 | HR 61 | Temp 98.0°F | Resp 13 | Ht 69.5 in | Wt 212.0 lb

## 2022-11-21 DIAGNOSIS — Z1211 Encounter for screening for malignant neoplasm of colon: Secondary | ICD-10-CM | POA: Diagnosis not present

## 2022-11-21 DIAGNOSIS — D123 Benign neoplasm of transverse colon: Secondary | ICD-10-CM | POA: Diagnosis not present

## 2022-11-21 DIAGNOSIS — Z09 Encounter for follow-up examination after completed treatment for conditions other than malignant neoplasm: Secondary | ICD-10-CM | POA: Diagnosis not present

## 2022-11-21 DIAGNOSIS — E785 Hyperlipidemia, unspecified: Secondary | ICD-10-CM | POA: Diagnosis not present

## 2022-11-21 DIAGNOSIS — Z86010 Personal history of colonic polyps: Secondary | ICD-10-CM | POA: Diagnosis not present

## 2022-11-21 DIAGNOSIS — F419 Anxiety disorder, unspecified: Secondary | ICD-10-CM | POA: Diagnosis not present

## 2022-11-21 MED ORDER — SODIUM CHLORIDE 0.9 % IV SOLN
500.0000 mL | Freq: Once | INTRAVENOUS | Status: DC
Start: 2022-11-21 — End: 2022-11-21

## 2022-11-21 NOTE — Patient Instructions (Signed)
Please read handouts provided. Continue present medications. Await pathology results. High Fiber Diet.   YOU HAD AN ENDOSCOPIC PROCEDURE TODAY AT THE [facility]:   Refer to the procedure report that was given to you for any specific questions about what was found during the examination.  If the procedure report does not answer your questions, please call your gastroenterologist to clarify.  If you requested that your care partner not be given the details of your procedure findings, then the procedure report has been included in a sealed envelope for you to review at your convenience later.  YOU SHOULD EXPECT: Some feelings of bloating in the abdomen. Passage of more gas than usual.  Walking can help get rid of the air that was put into your GI tract during the procedure and reduce the bloating. If you had a lower endoscopy (such as a colonoscopy or flexible sigmoidoscopy) you may notice spotting of blood in your stool or on the toilet paper. If you underwent a bowel prep for your procedure, you may not have a normal bowel movement for a few days.  Please Note:  You might notice some irritation and congestion in your nose or some drainage.  This is from the oxygen used during your procedure.  There is no need for concern and it should clear up in a day or so.  SYMPTOMS TO REPORT IMMEDIATELY:  Following lower endoscopy (colonoscopy or flexible sigmoidoscopy):  Excessive amounts of blood in the stool  Significant tenderness or worsening of abdominal pains  Swelling of the abdomen that is new, acute  Fever of 100F or higher.  For urgent or emergent issues, a gastroenterologist can be reached at any hour by calling [phone]. Do not use MyChart messaging for urgent concerns.    DIET:  We do recommend a small meal at first, but then you may proceed to your regular diet.  Drink plenty of fluids but you should avoid alcoholic beverages for 24 hours.  ACTIVITY:  You should plan  to take it easy for the rest of today and you should NOT DRIVE or use heavy machinery until tomorrow (because of the sedation medicines used during the test).    FOLLOW UP: Our staff will call the number listed on your records the next business day following your procedure.  We will call around 7:15- 8:00 am to check on you and address any questions or concerns that you may have regarding the information given to you following your procedure. If we do not reach you, we will leave a message.     If any biopsies were taken you will be contacted by phone or by letter within the next 1-3 weeks.  Please call us at [phone] if you have not heard about the biopsies in 3 weeks.    SIGNATURES/CONFIDENTIALITY: You and/or your care partner have signed paperwork which will be entered into your electronic medical record.  These signatures attest to the fact that that the information above on your After Visit Summary has been reviewed and is understood.  Full responsibility of the confidentiality of this discharge information lies with you and/or your care-partner.  

## 2022-11-21 NOTE — Progress Notes (Signed)
History & Physical  Primary Care Physician:  Myrlene Broker, MD Primary Gastroenterologist: Claudette Head, MD  Impression / Plan:  Personal history of adenomatous colon polyps for surveillance colonoscopy.  CHIEF COMPLAINT:  Personal history of colon polyps   HPI: Jeffrey Dougherty is a 62 y.o. male with a personal history of adenomatous colon polyps for surveillance colonoscopy.  He is a patient of Dr. Christella Hartigan.   Past Medical History:  Diagnosis Date   Allergy    Alfa Gal   Anal or rectal pain 01/2009   Anxiety state, unspecified    Arthritis    Chronic kidney disease    kidney stones   External otitis    History of chickenpox    Hyperlipidemia    Nonspecific elevation of levels of transaminase or lactic acid dehydrogenase (LDH)    Pneumonia    Pneumonia, organism unspecified(486)    Preglaucoma, unspecified    Prostatitis    Temporomandibular joint disorders, unspecified     Past Surgical History:  Procedure Laterality Date   APPENDECTOMY     COLONOSCOPY     POLYPECTOMY     TONSILLECTOMY     tracheotomy     VASECTOMY      Prior to Admission medications   Medication Sig Start Date End Date Taking? Authorizing Provider  allopurinol (ZYLOPRIM) 300 MG tablet TAKE 1 TABLET BY MOUTH EVERY DAY 11/03/22  Yes Myrlene Broker, MD  ALPRAZolam Prudy Feeler) 0.5 MG tablet Take 1 tablet (0.5 mg total) by mouth daily as needed. 09/18/22  Yes Myrlene Broker, MD  rosuvastatin (CRESTOR) 10 MG tablet TAKE 1 TABLET (10 MG TOTAL) BY MOUTH DAILY. PLEASE CALL OUR OFFICE TO SCHEDULE A FOLLOW UP 08/30/22  Yes Myrlene Broker, MD  EPINEPHrine 0.3 mg/0.3 mL IJ SOAJ injection Inject 0.3 mg into the muscle as needed for anaphylaxis. Patient not taking: Reported on 11/21/2022 04/15/18   [provider]  ibuprofen (ADVIL) 800 MG tablet Take 1 tablet (800 mg total) by mouth every 8 (eight) hours as needed. Patient not taking: Reported on 10/31/2022 05/28/22    Vivi Barrack, DPM  tamsulosin (FLOMAX) 0.4 MG CAPS capsule Take 0.4 mg by mouth daily. Patient not taking: Reported on 11/21/2022    [provider]    Current Outpatient Medications  Medication Sig Dispense Refill   allopurinol (ZYLOPRIM) 300 MG tablet TAKE 1 TABLET BY MOUTH EVERY DAY 30 tablet 11   ALPRAZolam (XANAX) 0.5 MG tablet Take 1 tablet (0.5 mg total) by mouth daily as needed. 90 tablet 0   rosuvastatin (CRESTOR) 10 MG tablet TAKE 1 TABLET (10 MG TOTAL) BY MOUTH DAILY. PLEASE CALL OUR OFFICE TO SCHEDULE A FOLLOW UP 30 tablet 8   EPINEPHrine 0.3 mg/0.3 mL IJ SOAJ injection Inject 0.3 mg into the muscle as needed for anaphylaxis. (Patient not taking: Reported on 11/21/2022)     ibuprofen (ADVIL) 800 MG tablet Take 1 tablet (800 mg total) by mouth every 8 (eight) hours as needed. (Patient not taking: Reported on 10/31/2022) 30 tablet 0   tamsulosin (FLOMAX) 0.4 MG CAPS capsule Take 0.4 mg by mouth daily. (Patient not taking: Reported on 11/21/2022)     Current Facility-Administered Medications  Medication Dose Route Frequency Provider Last Rate Last Admin   0.9 %  sodium chloride infusion  500 mL Intravenous Once Meryl Dare, MD        Allergies as of 11/21/2022 - Review Complete 11/21/2022  Allergen Reaction Noted  Ciprofloxacin  07/25/2010    Family History  Problem Relation Age of Onset   Alcohol abuse Mother    Hip fracture Mother    Cancer Mother        breast   Breast cancer Mother    Allergic rhinitis Mother    Cancer Father        Prostate cancer   Arthritis Father        rheumatoid   Colon polyps Father    Prostate cancer Father    Pancreatic cancer Brother    Colon polyps Brother    Coronary artery disease Neg Hx    Stroke Neg Hx    Colon cancer Neg Hx    Esophageal cancer Neg Hx    Stomach cancer Neg Hx    Rectal cancer Neg Hx     Social History   Socioeconomic History   Marital status: Married    Spouse name: Not on file    Number of children: Not on file   Years of education: 16   Highest education level: Not on file  Occupational History   Occupation: PARTS DIRECTOR    Employer: [employer]  Tobacco Use   Smoking status: Former    Packs/day: .5    Types: Cigarettes   Smokeless tobacco: Never  Vaping Use   Vaping Use: Never used  Substance and Sexual Activity   Alcohol use: Yes    Alcohol/week: 48.0 standard drinks of alcohol    Types: 48 Cans of beer per week   Drug use: No   Sexual activity: Yes    Partners: Female  Other Topics Concern   Not on file  Social History Narrative   HSG. Married-'87.  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 . Marriage in good health.         Social Determinants of Health   Financial Resource Strain: Not on file  Food Insecurity: Not on file  Transportation Needs: Not on file  Physical Activity: Not on file  Stress: Not on file  Social Connections: Not on file  Intimate Partner Violence: Not on file    Review of Systems:  All systems reviewed were negative except where noted in HPI.   Physical Exam:  General:  Alert, well-developed, in NAD Head:  Normocephalic and atraumatic. Eyes:  Sclera clear, no icterus.   Conjunctiva pink. Ears:  Normal auditory acuity. Mouth:  No deformity or lesions.  Neck:  Supple; no masses. Lungs:  Clear throughout to auscultation.   No wheezes, crackles, or rhonchi.  Heart:  Regular rate and rhythm; no murmurs. Abdomen:  Soft, nondistended, nontender. No masses, hepatomegaly. No palpable masses.  Normal bowel sounds.    Rectal:  Deferred   Msk:  Symmetrical without gross deformities. Extremities:  Without edema. Neurologic:  Alert and  oriented x 4; grossly normal neurologically. Skin:  Intact without significant lesions or rashes. Psych:  Alert and cooperative. Normal mood and affect.   Venita Lick. Russella Dar  11/21/2022, 8:05 AM See Loretha Stapler,  [facility], to contact our on call provider

## 2022-11-21 NOTE — Progress Notes (Signed)
Report given to PACU, vss 

## 2022-11-21 NOTE — Progress Notes (Signed)
Pt's states no medical or surgical changes since previsit or office visit. 

## 2022-11-21 NOTE — Op Note (Signed)
[facility] Patient Name: Jeffrey Dougherty Procedure Date: 11/21/2022 8:04 AM MRN: [id number] Endoscopist: Meryl Dare , MD, [phone] Age: 62 Referring MD:  Date of Birth: 1961/01/22 Gender: Male Account #: [id number] Procedure:                Colonoscopy Indications:              Surveillance: Personal history of adenomatous                            polyps on last colonoscopy > 5 years ago Medicines:                Monitored Anesthesia Care Procedure:                Pre-Anesthesia Assessment:                           - Prior to the procedure, a History and Physical                            was performed, and patient medications and                            allergies were reviewed. The patient's tolerance of                            previous anesthesia was also reviewed. The risks                            and benefits of the procedure and the sedation                            options and risks were discussed with the patient.                            All questions were answered, and informed consent                            was obtained. Prior Anticoagulants: The patient has                            taken no anticoagulant or antiplatelet agents. ASA                            Grade Assessment: II - A patient with mild systemic                            disease. After reviewing the risks and benefits,                            the patient was deemed in satisfactory condition to                            undergo the procedure.  After obtaining informed consent, the colonoscope                            was passed under direct vision. Throughout the                            procedure, the patient's blood pressure, pulse, and                            oxygen saturations were monitored continuously. The                            CF [id number] #[id number] was introduced through the anus                            and advanced to the  the cecum, identified by                            appendiceal orifice and ileocecal valve. The                            ileocecal valve, appendiceal orifice, and rectum                            were photographed. The quality of the bowel                            preparation was good. The colonoscopy was performed                            without difficulty. The patient tolerated the                            procedure well. Scope In: 8:11:31 AM Scope Out: 8:22:08 AM Scope Withdrawal Time: 0 hours 8 minutes 44 seconds  Total Procedure Duration: 0 hours 10 minutes 37 seconds  Findings:                 The perianal and digital rectal examinations were                            normal.                           An 8 mm polyp was found in the transverse colon.                            The polyp was sessile. The polyp was removed with a                            cold snare. Resection and retrieval were complete.                           Scattered small-mouthed diverticula were found in  the right colon. There was no evidence of                            diverticular bleeding.                           Multiple medium-mouthed and small-mouthed                            diverticula were found in the left colon. There was                            narrowing of the colon in association with the                            diverticular opening. Peri-diverticular erythema                            was seen. There was no evidence of diverticular                            bleeding.                           External and internal hemorrhoids were found during                            retroflexion. The hemorrhoids were small and Grade                            I (internal hemorrhoids that do not prolapse).                           The exam was otherwise without abnormality on                            direct and retroflexion views. Complications:             No immediate complications. Estimated blood loss:                            None. Estimated Blood Loss:     Estimated blood loss: none. Impression:               - One 8 mm polyp in the transverse colon, removed                            with a cold snare. Resected and retrieved.                           - Mild diverticulosis in the right colon.                           - Moderate diverticulosis in the left colon.                           -  External and internal hemorrhoids.                           - The examination was otherwise normal on direct                            and retroflexion views. Recommendation:           - Repeat colonoscopy after studies are complete for                            surveillance based on pathology results.                           - Patient has a contact number available for                            emergencies. The signs and symptoms of potential                            delayed complications were discussed with the                            patient. Return to normal activities tomorrow.                            Written discharge instructions were provided to the                            patient.                           - High fiber diet.                           - Continue present medications.                           - Await pathology results. Meryl Dare, MD 11/21/2022 8:26:22 AM This report has been signed electronically.

## 2022-11-21 NOTE — Progress Notes (Signed)
Called to room to assist during endoscopic procedure.  Patient ID and intended procedure confirmed with present staff. Received instructions for my participation in the procedure from the performing physician.  

## 2022-11-22 ENCOUNTER — Telehealth: Payer: Self-pay

## 2022-11-22 NOTE — Telephone Encounter (Signed)
Post procedure follow up call, no answer 

## 2022-12-06 ENCOUNTER — Encounter: Payer: Self-pay | Admitting: Gastroenterology

## 2023-03-01 DIAGNOSIS — Z6831 Body mass index (BMI) 31.0-31.9, adult: Secondary | ICD-10-CM | POA: Diagnosis not present

## 2023-03-01 DIAGNOSIS — U071 COVID-19: Secondary | ICD-10-CM | POA: Diagnosis not present

## 2023-05-10 ENCOUNTER — Other Ambulatory Visit: Payer: Self-pay | Admitting: Internal Medicine

## 2023-05-11 MED ORDER — ALPRAZOLAM 0.5 MG PO TABS: 0.5000 mg | ORAL_TABLET | Freq: Every day | ORAL | 0 refills | Status: DC | PRN

## 2023-07-25 ENCOUNTER — Other Ambulatory Visit: Payer: Self-pay | Admitting: Internal Medicine

## 2023-07-25 MED ORDER — ALPRAZOLAM 0.5 MG PO TABS: 0.5000 mg | ORAL_TABLET | Freq: Every day | ORAL | 1 refills | Status: DC | PRN

## 2023-08-01 ENCOUNTER — Encounter: Payer: Self-pay | Admitting: Internal Medicine

## 2023-08-01 ENCOUNTER — Other Ambulatory Visit: Payer: Self-pay | Admitting: Internal Medicine

## 2023-08-01 DIAGNOSIS — Z0000 Encounter for general adult medical examination without abnormal findings: Secondary | ICD-10-CM

## 2023-08-01 DIAGNOSIS — E1169 Type 2 diabetes mellitus with other specified complication: Secondary | ICD-10-CM

## 2023-08-01 DIAGNOSIS — M1A0790 Idiopathic chronic gout, unspecified ankle and foot, without tophus (tophi): Secondary | ICD-10-CM

## 2023-09-17 ENCOUNTER — Other Ambulatory Visit (INDEPENDENT_AMBULATORY_CARE_PROVIDER_SITE_OTHER)

## 2023-09-17 DIAGNOSIS — Z0000 Encounter for general adult medical examination without abnormal findings: Secondary | ICD-10-CM | POA: Diagnosis not present

## 2023-09-17 DIAGNOSIS — M1A0790 Idiopathic chronic gout, unspecified ankle and foot, without tophus (tophi): Secondary | ICD-10-CM | POA: Diagnosis not present

## 2023-09-17 DIAGNOSIS — E1169 Type 2 diabetes mellitus with other specified complication: Secondary | ICD-10-CM

## 2023-09-17 LAB — LIPID PANEL
Cholesterol: 175 mg/dL (ref 0–200)
HDL: 75.4 mg/dL (ref >39.00)
LDL Cholesterol: 74 mg/dL (ref 0–99)
NonHDL: 99.15
Total CHOL/HDL Ratio: 2
Triglycerides: 125 mg/dL (ref 0.0–149.0)
VLDL: 25 mg/dL (ref 0.0–40.0)

## 2023-09-17 LAB — CBC
HCT: 44.7 % (ref 39.0–52.0)
Hemoglobin: 15.1 g/dL (ref 13.0–17.0)
MCHC: 33.9 g/dL (ref 30.0–36.0)
MCV: 97 fl (ref 78.0–100.0)
Platelets: 189 10*3/uL (ref 150.0–400.0)
RBC: 4.61 Mil/uL (ref 4.22–5.81)
RDW: 13.8 % (ref 11.5–15.5)
WBC: 5 10*3/uL (ref 4.0–10.5)

## 2023-09-17 LAB — COMPREHENSIVE METABOLIC PANEL
ALT: 21 U/L (ref 0–53)
AST: 21 U/L (ref 0–37)
Albumin: 4.1 g/dL (ref 3.5–5.2)
Alkaline Phosphatase: 49 U/L (ref 39–117)
BUN: 15 mg/dL (ref 6–23)
CO2: 25 meq/L (ref 19–32)
Calcium: 9.1 mg/dL (ref 8.4–10.5)
Chloride: 102 meq/L (ref 96–112)
Creatinine, Ser: 0.9 mg/dL (ref 0.40–1.50)
GFR: 91.03 mL/min (ref >60.00)
Glucose, Bld: 150 mg/dL — ABNORMAL HIGH (ref 70–99)
Potassium: 4.1 meq/L (ref 3.5–5.1)
Sodium: 135 meq/L (ref 135–145)
Total Bilirubin: 0.5 mg/dL (ref 0.2–1.2)
Total Protein: 6.4 g/dL (ref 6.0–8.3)

## 2023-09-17 LAB — HEMOGLOBIN A1C: Hgb A1c MFr Bld: 7.2 % — ABNORMAL HIGH (ref 4.6–6.5)

## 2023-09-17 LAB — URIC ACID: Uric Acid, Serum: 4.8 mg/dL (ref 4.0–7.8)

## 2023-09-17 LAB — PSA: PSA: 0.72 ng/mL (ref 0.10–4.00)

## 2023-09-17 LAB — MICROALBUMIN / CREATININE URINE RATIO
Creatinine,U: 148.7 mg/dL
Microalb Creat Ratio: UNDETERMINED mg/g (ref 0.0–30.0)
Microalb, Ur: <0.7 mg/dL

## 2023-09-17 NOTE — Progress Notes (Unsigned)
 Follow Up Note  RE: Jeffrey Dougherty MRN: [id number] DOB: 1961/03/23 Date of Office Visit: 09/18/2023  Referring provider: Myrlene Broker, * Primary care provider: Myrlene Broker, MD  Chief Complaint: No chief complaint on file.  History of Present Illness: I had the pleasure of seeing Jeffrey Dougherty for a follow up visit at the [facility] on 09/17/2023. He is a 63 y.o. male, who is being followed for alpha gal allergy. His previous allergy office visit was on 03/21/2022 with Dr. Selena Batten. Today is a regular follow up visit.  Discussed the use of AI scribe software for clinical note transcription with the patient, who gave verbal consent to proceed.  History of Present Illness            2023 labs: "Your alpha-gal panel was still positive at 26.7. This is better than the level of 57 back in 2019. Continue strict avoidance of all mammalian meat. Will recheck in 1 year. "  Assessment and Plan: Jeffrey Dougherty is a 63 y.o. male with: Allergy to alpha-gal Allergic reaction in 2019 after eating hamburger in the form of rash, hypotension, diarrhea, dizziness - treated in the ER. 2019 alpha gal IgE 57.40. No additional episodes since avoiding red meat but would like to reintroduce. Owns a hunting business. Continue to avoid red meat (all mammalian meat). Avoid future tick bites.  Get bloodwork. And if it looks favorable will do an in office food challenge next.  For mild symptoms you can take over the counter antihistamines such as Benadryl and monitor symptoms closely. If symptoms worsen or if you have severe symptoms including breathing issues, throat closure, significant swelling, whole body hives, severe diarrhea and vomiting, lightheadedness then inject epinephrine and seek immediate medical care afterwards. Assessment and Plan              No follow-ups on file.  No orders of the defined types were placed in this encounter.  Lab Orders  No  laboratory test(s) ordered today    Diagnostics: Spirometry:  Tracings reviewed. His effort: {Blank single:19197::"Good reproducible efforts.","It was hard to get consistent efforts and there is a question as to whether this reflects a maximal maneuver.","Poor effort, data can not be interpreted."} FVC: ***L FEV1: ***L, ***% predicted FEV1/FVC ratio: ***% Interpretation: {Blank single:19197::"Spirometry consistent with mild obstructive disease","Spirometry consistent with moderate obstructive disease","Spirometry consistent with severe obstructive disease","Spirometry consistent with possible restrictive disease","Spirometry consistent with mixed obstructive and restrictive disease","Spirometry uninterpretable due to technique","Spirometry consistent with normal pattern","No overt abnormalities noted given today's efforts"}.  Please see scanned spirometry results for details.  Skin Testing: {Blank single:19197::"Select foods","Environmental allergy panel","Environmental allergy panel and select foods","Food allergy panel","None","Deferred due to recent antihistamines use"}. *** Results discussed with patient/family.   Medication List:  Current Outpatient Medications  Medication Sig Dispense Refill   allopurinol (ZYLOPRIM) 300 MG tablet TAKE 1 TABLET BY MOUTH EVERY DAY 30 tablet 11   ALPRAZolam (XANAX) 0.5 MG tablet Take 1 tablet (0.5 mg total) by mouth daily as needed. 30 tablet 1   EPINEPHrine 0.3 mg/0.3 mL IJ SOAJ injection Inject 0.3 mg into the muscle as needed for anaphylaxis. (Patient not taking: Reported on 11/21/2022)     ibuprofen (ADVIL) 800 MG tablet Take 1 tablet (800 mg total) by mouth every 8 (eight) hours as needed. (Patient not taking: Reported on 10/31/2022) 30 tablet 0   rosuvastatin (CRESTOR) 10 MG tablet TAKE 1 TABLET (10 MG TOTAL) BY MOUTH DAILY. PLEASE CALL OUR OFFICE  TO SCHEDULE A FOLLOW UP 90 tablet 0   tamsulosin (FLOMAX) 0.4 MG CAPS capsule Take 0.4 mg by mouth daily.  (Patient not taking: Reported on 11/21/2022)     No current facility-administered medications for this visit.   Allergies: Allergies  Allergen Reactions   Ciprofloxacin     anxiety   I reviewed his past medical history, social history, family history, and environmental history and no significant changes have been reported from his previous visit.  Review of Systems  Constitutional:  Negative for appetite change, chills, fever and unexpected weight change.  HENT:  Negative for congestion and rhinorrhea.   Eyes:  Negative for itching.  Respiratory:  Negative for cough, chest tightness, shortness of breath and wheezing.   Cardiovascular:  Negative for chest pain.  Gastrointestinal:  Negative for abdominal pain.  Genitourinary:  Negative for difficulty urinating.  Skin:  Negative for rash.  Allergic/Immunologic: Positive for food allergies.  Neurological:  Negative for headaches.    Objective: There were no vitals taken for this visit. There is no height or weight on file to calculate BMI. Physical Exam Vitals and nursing note reviewed.  Constitutional:      Appearance: Normal appearance. He is well-developed.  HENT:     Head: Normocephalic and atraumatic.     Right Ear: Tympanic membrane and external ear normal.     Left Ear: Tympanic membrane and external ear normal.     Nose: Nose normal.     Mouth/Throat:     Mouth: Mucous membranes are moist.     Pharynx: Oropharynx is clear.  Eyes:     Conjunctiva/sclera: Conjunctivae normal.  Cardiovascular:     Rate and Rhythm: Normal rate and regular rhythm.     Heart sounds: Normal heart sounds. No murmur heard.    No friction rub. No gallop.  Pulmonary:     Effort: Pulmonary effort is normal.     Breath sounds: Normal breath sounds. No wheezing, rhonchi or rales.  Musculoskeletal:     Cervical back: Neck supple.  Skin:    General: Skin is warm.     Findings: No rash.  Neurological:     Mental Status: He is alert and  oriented to person, place, and time.  Psychiatric:        Behavior: Behavior normal.    Previous notes and tests were reviewed. The plan was reviewed with the patient/family, and all questions/concerned were addressed.  It was my pleasure to see Jeffrey Dougherty today and participate in his care. Please feel free to contact me with any questions or concerns.  Sincerely,  Wyline Mood, DO Allergy & Immunology  [facility] office: [phone] [facility] office: [phone]

## 2023-09-18 ENCOUNTER — Encounter: Payer: Self-pay | Admitting: Allergy

## 2023-09-18 ENCOUNTER — Ambulatory Visit (INDEPENDENT_AMBULATORY_CARE_PROVIDER_SITE_OTHER): Payer: 59 | Admitting: Allergy

## 2023-09-18 VITALS — BP 102/64 | HR 74 | Temp 98.3°F | Resp 12 | Ht 69.0 in | Wt 213.5 lb

## 2023-09-18 DIAGNOSIS — Z91018 Allergy to other foods: Secondary | ICD-10-CM | POA: Diagnosis not present

## 2023-09-18 NOTE — Patient Instructions (Addendum)
 Alpha gal allergy Continue to avoid all mammalian meat.  Avoid future tick bites.  Get bloodwork And if it looks favorable will do an in office food challenge next.  We are ordering labs, so please allow 1-2 weeks for the results to come back. With the newly implemented Cures Act, the labs might be visible to you at the same time that they become visible to me. However, I will not address the results until all of the results are back, so please be patient.  In the meantime, continue recommendations in your patient instructions, including avoidance measures (if applicable), until you hear from me. For mild symptoms you can take over the counter antihistamines such as Benadryl 1-2 tablets = 25-50mg  and monitor symptoms closely. If symptoms worsen or if you have severe symptoms including breathing issues, throat closure, significant swelling, whole body hives, severe diarrhea and vomiting, lightheadedness then inject epinephrine and seek immediate medical care afterwards. Emergency action plan in place.   Follow up in 1 year.   Alpha-gal and Red Meat Allergy   Overview An allergy to "alpha-gal" refers to having a severe and potentially life-threatening allergy to a carbohydrate molecule called galactose-alpha-1,3-galactose that is found in most mammalian or "red meat". Unlike other food allergies which typically occur within minutes of ingestion, symptoms from eating red meat such as pork, lamb or beef may be delayed, occurring 3-8 hours after eating. Most food allergies are directed against a protein molecule, but alpha-gal is unusual because it is a carbohydrate, and a delay in its absorption may explain the delay in symptoms.  Helpful website: [employer]ikerFestival.is  What are the symptoms of an alpha-gal allergy? As with other food allergies, signs or symptoms of an allergy to alpha-gal may include: Hives and itching  Swelling of your lips, face or eyelids  Shortness of breath, cough or  wheezing  Abdominal pain, nausea, diarrhea or vomiting The most severe reaction, anaphylaxis, can present as a combination of several of these symptoms, may include low blood pressure, and is potentially fatal.  Because these symptoms are delayed, you may only wake up with them in the middle of the night after an evening meal.  How is an alpha-gal allergy diagnosed? Diagnosis of this allergy starts with your allergist taking an appropriate history and physical examination. Because the onset is usually quite delayed, it can be hard to associate the symptoms with eating red meat many hours previously. Triggers include any red meat - including beef, pork, lamb or even horse products. It may occur after eating hotdogs and hamburgers. In very rare cases the reaction may extend to milk or dairy proteins and gelatin.  Your allergist may recommend testing that includes skin tests to the relevant animal proteins and blood tests which measure the levels of a specific immunoglobulin E (IgE) antibody, to mammalian meats. An investigational blood test, IgE against alpha-gal itself, may also aid in the diagnosis.  How is an alpha-gal allergy treated? Immediate symptoms such as hives or shortness of breath are treated the same as any other food allergy - in an urgent care setting with anti-histamines, epinephrine and other medications. Prevention long-term involves avoidance of all red meat in sensitized individuals. You may be advised to carry an epinephrine auto-injector, to be used in case of subsequent accidental exposures and reaction. These measures do not necessarily mean switching to a full vegetarian diet, since poultry and fish can be consumed and do not cause similar reactions. As with other food allergies, there is the  possibility that over time the sensitivity diminishes - although these changes may take many years to become apparent.  How do you become allergic to alpha-gal? Alpha-gal is a molecule  carried in the saliva of the [place] tick and other potential arthropods typically after feeding on mammalian blood. People that are bitten by the tick, especially those that are bitten repeatedly, are at risk of becoming sensitized and producing the IgE necessary to then cause allergic reactions. Interestingly, allergic reactions may occur to red meat, to subsequent tick bites, and even to medications that contain alpha-gal. Cetuximab is a cancer medication that contains alpha-gal, and people who have had allergic reactions to this medication (these are typically immediate reactions, because it is infused intravenously) have a higher risk for red meat allergy and are likely to have been bitten by ticks in the past. As might be expected, the incidence of tick bites is much higher in the [place] the traditional habitat for the tick. However, cases are now increasingly reported in the [place] and [place] states. And it is a phenomenon that has been observed worldwide, with different ticks responsible for similar cases of red meat allergy in many other countries such as [place], [place] and [place].  The discovery of this peculiar allergy has allowed researchers to correlate tick bites with many cases of anaphylaxis that would previously have been classified as 'idiopathic', or of unknown cause. Also, while it was originally thought that the [employer] tick had to feast on mammalian blood in order to carry the alpha-gal molecule, more recent research has shown that it may carry this molecule and be capable of sensitizing humans independently.  How do you prevent an alpha-gal allergy? Because this allergy is predominantly tick born, you are more likely at risk if you often go outdoors in wooded areas for activities such as hiking, fishing or hunting. The key strategy is to prevent tick bites. This may include wearing long sleeved shirts or pants, using appropriate insect repellants, and  surveying for ticks after spending time outdoors. Any observed ticks should be removed carefully by cleaning the site with rubbing alcohol, then using tweezers to pull the tick's head up carefully from the skin using steady pressure. Clean your hands and the site one more time and make sure not to crush the tick between your fingers.

## 2023-09-19 ENCOUNTER — Encounter: Payer: Self-pay | Admitting: Internal Medicine

## 2023-09-19 ENCOUNTER — Ambulatory Visit (INDEPENDENT_AMBULATORY_CARE_PROVIDER_SITE_OTHER): Payer: 59 | Admitting: Internal Medicine

## 2023-09-19 VITALS — BP 120/80 | HR 66 | Temp 98.0°F | Ht 69.0 in | Wt 212.0 lb

## 2023-09-19 DIAGNOSIS — Z0000 Encounter for general adult medical examination without abnormal findings: Secondary | ICD-10-CM

## 2023-09-19 DIAGNOSIS — E785 Hyperlipidemia, unspecified: Secondary | ICD-10-CM

## 2023-09-19 DIAGNOSIS — E1169 Type 2 diabetes mellitus with other specified complication: Secondary | ICD-10-CM | POA: Diagnosis not present

## 2023-09-19 DIAGNOSIS — M1A0790 Idiopathic chronic gout, unspecified ankle and foot, without tophus (tophi): Secondary | ICD-10-CM

## 2023-09-19 DIAGNOSIS — F411 Generalized anxiety disorder: Secondary | ICD-10-CM | POA: Diagnosis not present

## 2023-09-19 DIAGNOSIS — Z789 Other specified health status: Secondary | ICD-10-CM

## 2023-09-19 MED ORDER — TAMSULOSIN HCL 0.4 MG PO CAPS: 0.8000 mg | ORAL_CAPSULE | Freq: Every day | ORAL | 3 refills | Status: DC

## 2023-09-19 MED ORDER — ALPRAZOLAM 0.5 MG PO TABS: 0.5000 mg | ORAL_TABLET | Freq: Every day | ORAL | 5 refills | Status: AC | PRN

## 2023-09-19 NOTE — Assessment & Plan Note (Signed)
 Flu shot up to date. Shingrix complete. Tetanus declines. Colonoscopy up to date. Counseled about sun safety and mole surveillance. Counseled about the dangers of distracted driving. Given 10 year screening recommendations.

## 2023-09-19 NOTE — Patient Instructions (Signed)
 We have sent in flomax (tamsulosin) to take 1 pill at night time daily. After 2-3 weeks increase to 2 pills at night time if you are not having enough improvement.

## 2023-09-19 NOTE — Assessment & Plan Note (Signed)
 LDL at goal on crestor 10 mg daily and refilled.

## 2023-09-19 NOTE — Assessment & Plan Note (Signed)
 Uric acid at goal on allopurinol 300 mg daily. Reviewed labs with him and refilled.

## 2023-09-19 NOTE — Progress Notes (Signed)
   Subjective:   Patient ID: Jeffrey Dougherty, male    DOB: 05-20-1961, 63 y.o.   MRN: [id number]  HPI The patient is here for physical.  PMH, [facility], social history reviewed and updated  Review of Systems  Constitutional: Negative.   HENT: Negative.    Eyes: Negative.   Respiratory:  Negative for cough, chest tightness and shortness of breath.   Cardiovascular:  Negative for chest pain, palpitations and leg swelling.  Gastrointestinal:  Negative for abdominal distention, abdominal pain, constipation, diarrhea, nausea and vomiting.  Musculoskeletal: Negative.   Skin: Negative.   Neurological: Negative.   Psychiatric/Behavioral: Negative.      Objective:  Physical Exam Constitutional:      Appearance: He is well-developed.  HENT:     Head: Normocephalic and atraumatic.  Cardiovascular:     Rate and Rhythm: Normal rate and regular rhythm.  Pulmonary:     Effort: Pulmonary effort is normal. No respiratory distress.     Breath sounds: Normal breath sounds. No wheezing or rales.  Abdominal:     General: Bowel sounds are normal. There is no distension.     Palpations: Abdomen is soft.     Tenderness: There is no abdominal tenderness. There is no rebound.  Musculoskeletal:     Cervical back: Normal range of motion.  Skin:    General: Skin is warm and dry.     Comments: Foot exam done  Neurological:     Mental Status: He is alert and oriented to person, place, and time.     Coordination: Coordination normal.     Vitals:   09/19/23 0759  BP: 120/80  Pulse: 66  Temp: 98 F (36.7 C)  TempSrc: Oral  SpO2: 98%  Weight: 212 lb (96.2 kg)  Height: 5\' 9"  (1.753 m)    Assessment & Plan:

## 2023-09-19 NOTE — Assessment & Plan Note (Signed)
 Foot exam done. Reviewed labs with him and on statin. Labs up to date. Diet controlled. We did discuss diet modifications including lowering carbs and alcohol to help.

## 2023-09-19 NOTE — Assessment & Plan Note (Signed)
 Counseled about use and not drinking alcohol daily currently but he recognizes that this is a weakness of his.

## 2023-09-19 NOTE — Assessment & Plan Note (Signed)
 Uses alprazolam daily prn. Discussed use and refilled at visit. Reviewed PDMP.

## 2023-09-24 ENCOUNTER — Other Ambulatory Visit: Payer: Self-pay | Admitting: Allergy

## 2023-09-24 ENCOUNTER — Encounter: Payer: Self-pay | Admitting: Allergy

## 2023-09-24 LAB — ALPHA-GAL PANEL
Allergen Lamb IgE: 2.6 kU/L — AB
Beef IgE: 29 kU/L — AB
IgE (Immunoglobulin E), Serum: 166 [IU]/mL (ref 6–495)
O215-IgE Alpha-Gal: 46.2 kU/L — AB
Pork IgE: 11.4 kU/L — AB

## 2023-09-24 MED ORDER — EPINEPHRINE 0.3 MG/0.3ML IJ SOAJ: 0.3000 mg | INTRAMUSCULAR | 1 refills | Status: AC | PRN

## 2023-10-09 ENCOUNTER — Other Ambulatory Visit: Payer: Self-pay | Admitting: Internal Medicine

## 2023-12-15 ENCOUNTER — Other Ambulatory Visit: Payer: Self-pay | Admitting: Internal Medicine

## 2024-03-11 ENCOUNTER — Other Ambulatory Visit: Payer: Self-pay | Admitting: Internal Medicine

## 2024-06-05 ENCOUNTER — Other Ambulatory Visit: Payer: Self-pay | Admitting: Internal Medicine

## 2024-08-04 ENCOUNTER — Telehealth: Payer: Self-pay

## 2024-08-04 DIAGNOSIS — Z0000 Encounter for general adult medical examination without abnormal findings: Secondary | ICD-10-CM

## 2024-08-04 DIAGNOSIS — M1A0790 Idiopathic chronic gout, unspecified ankle and foot, without tophus (tophi): Secondary | ICD-10-CM

## 2024-08-04 DIAGNOSIS — E1169 Type 2 diabetes mellitus with other specified complication: Secondary | ICD-10-CM

## 2024-08-04 NOTE — Telephone Encounter (Unsigned)
 Copied from CRM [id number]. Topic: Clinical - Request for Lab/Test Order >> Aug 04, 2024 10:24 AM Larissa RAMAN wrote: Reason for CRM: Patient requesting to have labs completed before appointment for physical

## 2024-08-06 NOTE — Telephone Encounter (Signed)
 Labs placed.

## 2024-08-07 NOTE — Telephone Encounter (Signed)
 Pt is aware.

## 2024-09-22 ENCOUNTER — Encounter: Admitting: Internal Medicine
